# Patient Record
Sex: Male | Born: 1980 | Race: White | Hispanic: No | Marital: Married | Smoking: Never smoker
Health system: Southern US, Community
[De-identification: ages and names within clinical notes are randomized; demographics above are authoritative.]

## PROBLEM LIST (undated history)

## (undated) DIAGNOSIS — Z973 Presence of spectacles and contact lenses: Secondary | ICD-10-CM

## (undated) DIAGNOSIS — Z87442 Personal history of urinary calculi: Secondary | ICD-10-CM

## (undated) DIAGNOSIS — N200 Calculus of kidney: Secondary | ICD-10-CM

## (undated) DIAGNOSIS — N201 Calculus of ureter: Secondary | ICD-10-CM

## (undated) DIAGNOSIS — R001 Bradycardia, unspecified: Secondary | ICD-10-CM

## (undated) HISTORY — PX: NO PAST SURGERIES: SHX2092

---

## 2003-05-20 ENCOUNTER — Encounter: Payer: Self-pay | Admitting: Emergency Medicine

## 2003-05-20 ENCOUNTER — Emergency Department (HOSPITAL_COMMUNITY): Admission: AD | Admit: 2003-05-20 | Discharge: 2003-05-20 | Payer: Self-pay | Admitting: Emergency Medicine

## 2005-11-30 ENCOUNTER — Emergency Department (HOSPITAL_COMMUNITY): Admission: EM | Admit: 2005-11-30 | Discharge: 2005-11-30 | Payer: Self-pay | Admitting: Emergency Medicine

## 2006-05-30 ENCOUNTER — Emergency Department (HOSPITAL_COMMUNITY): Admission: EM | Admit: 2006-05-30 | Discharge: 2006-05-30 | Payer: Self-pay | Admitting: Emergency Medicine

## 2007-01-22 ENCOUNTER — Emergency Department (HOSPITAL_COMMUNITY): Admission: EM | Admit: 2007-01-22 | Discharge: 2007-01-22 | Payer: Self-pay | Admitting: Emergency Medicine

## 2012-09-26 ENCOUNTER — Emergency Department (HOSPITAL_COMMUNITY)
Admission: EM | Admit: 2012-09-26 | Discharge: 2012-09-26 | Disposition: A | Discharge status: Home / Self Care | Payer: Self-pay | Attending: Emergency Medicine | Admitting: Emergency Medicine

## 2012-09-26 ENCOUNTER — Encounter (HOSPITAL_COMMUNITY): Payer: Self-pay | Admitting: Emergency Medicine

## 2012-09-26 DIAGNOSIS — N200 Calculus of kidney: Secondary | ICD-10-CM | POA: Insufficient documentation

## 2012-09-26 DIAGNOSIS — R1110 Vomiting, unspecified: Secondary | ICD-10-CM | POA: Insufficient documentation

## 2012-09-26 LAB — CBC WITH DIFFERENTIAL/PLATELET
Basophils Absolute: 0.1 10*3/uL (ref 0.0–0.1)
HCT: 41 % (ref 39.0–52.0)
Hemoglobin: 14.5 g/dL (ref 13.0–17.0)
Lymphocytes Relative: 36 % (ref 12–46)
Monocytes Absolute: 0.4 10*3/uL (ref 0.1–1.0)
Monocytes Relative: 7 % (ref 3–12)
Neutro Abs: 2.7 10*3/uL (ref 1.7–7.7)
RDW: 12.4 % (ref 11.5–15.5)
WBC: 5.1 10*3/uL (ref 4.0–10.5)

## 2012-09-26 LAB — URINALYSIS, ROUTINE W REFLEX MICROSCOPIC
Glucose, UA: NEGATIVE mg/dL
Leukocytes, UA: NEGATIVE
Specific Gravity, Urine: 1.029 (ref 1.005–1.030)
pH: 5.5 (ref 5.0–8.0)

## 2012-09-26 LAB — BASIC METABOLIC PANEL
CO2: 28 mEq/L (ref 19–32)
Chloride: 103 mEq/L (ref 96–112)
Creatinine, Ser: 0.93 mg/dL (ref 0.50–1.35)
GFR calc Af Amer: >90 mL/min (ref >90)

## 2012-09-26 MED ORDER — SODIUM CHLORIDE 0.9 % IV SOLN
Freq: Once | INTRAVENOUS | Status: AC
  Administered 2012-09-26: 1000 mL via INTRAVENOUS

## 2012-09-26 MED ORDER — ONDANSETRON HCL 4 MG PO TABS: 4.0000 mg | ORAL_TABLET | Freq: Four times a day (QID) | ORAL | Status: DC

## 2012-09-26 MED ORDER — OXYCODONE-ACETAMINOPHEN 5-325 MG PO TABS: ORAL_TABLET | ORAL | Status: DC

## 2012-09-26 MED ORDER — KETOROLAC TROMETHAMINE 10 MG PO TABS: 10.0000 mg | ORAL_TABLET | Freq: Four times a day (QID) | ORAL | Status: DC | PRN

## 2012-09-26 MED ORDER — TAMSULOSIN HCL 0.4 MG PO CAPS: 0.4000 mg | ORAL_CAPSULE | Freq: Every day | ORAL | Status: DC

## 2012-09-26 NOTE — ED Provider Notes (Signed)
History     CSN: [id number]  Arrival date & time 09/26/12  0602   First MD Initiated Contact with Patient 09/26/12 0617      Chief Complaint  Patient presents with  . Flank Pain    (Consider location/radiation/quality/duration/timing/severity/associated sxs/prior treatment) HPI  Micheal Malone is a 31 y.o. male brought in by EMS complaining of right flank pain that radiates to the abdomen onset acute at 4 AM last night. Pain is severe at its worst it is 5/10 right now. Pain is described as sharp and colicky. Patient received Toradol en route and this has helped him significantly. Patient denies hematuria and fever but has had several episodes of nonbilious nonbloody emesis this morning. Patient has history of kidney stones. States that this feels more severe than the prior stones. He has never required any intervention to passed stone and cannot remember the name of his urologist at this time.   History reviewed. No pertinent past medical history.  History reviewed. No pertinent past surgical history.  History reviewed. No pertinent family history.  History  Substance Use Topics  . Smoking status: Never Smoker   . Smokeless tobacco: Current User    Types: Snuff  . Alcohol Use: Yes     Comment: occassionally      Review of Systems  Constitutional: Negative for fever.  Respiratory: Negative for shortness of breath.   Cardiovascular: Negative for chest pain.  Gastrointestinal: Negative for nausea, vomiting, abdominal pain and diarrhea.  Genitourinary: Positive for flank pain. Negative for hematuria and testicular pain.  All other systems reviewed and are negative.    Allergies  Keflex  Home Medications  No current outpatient prescriptions on file.  BP 124/79  Pulse 64  Temp 97.9 F (36.6 C) (Oral)  Resp 18  SpO2 98%  Physical Exam  Nursing note and vitals reviewed. Constitutional: He is oriented to person, place, and time. He appears well-developed and  well-nourished. No distress.       Patient seems calm and comfortable, lying still on the bed  HENT:  Head: Normocephalic.  Eyes: Conjunctivae normal and EOM are normal.  Cardiovascular: Normal rate and regular rhythm.   Pulmonary/Chest: Effort normal and breath sounds normal. No stridor.  Abdominal: Soft. Bowel sounds are normal. He exhibits no distension and no mass. There is tenderness. There is no rebound and no guarding.       Mild RUQ TTP no rebound or other peritoneal signs.  Genitourinary:       Mild bilateral CVA tenderness greater on the right than the left there  Musculoskeletal: Normal range of motion.  Neurological: He is alert and oriented to person, place, and time.  Psychiatric: He has a normal mood and affect.    ED Course  Procedures (including critical care time)  Labs Reviewed  URINALYSIS, ROUTINE W REFLEX MICROSCOPIC - Abnormal; Notable for the following:    Color, Urine AMBER (*)  BIOCHEMICALS MAY BE AFFECTED BY COLOR   APPearance CLOUDY (*)     Hgb urine dipstick LARGE (*)     Bilirubin Urine SMALL (*)     All other components within normal limits  BASIC METABOLIC PANEL - Abnormal; Notable for the following:    Glucose, Bld 116 (*)     All other components within normal limits  URINE MICROSCOPIC-ADD ON - Abnormal; Notable for the following:    Crystals CA OXALATE CRYSTALS (*)     All other components within normal limits  CBC WITH DIFFERENTIAL  No results found.   1. Kidney stone       MDM  Patient defers pain medicine at this time.  Urinalysis shows large amount of hemoglobin consistent with kidney stone. Patient's pain is controlled and he is tolerating by mouth at this time.  Discussed this with attending Dr. Ignacia Palma who agrees that a CAT scan is not indicated at this time. Patient will be discharged with symptomatic control and given a urology referral.  New Prescriptions   KETOROLAC (TORADOL) 10 MG TABLET    Take 1 tablet (10 mg total)  by mouth every 6 (six) hours as needed for pain (Take with food. Do not take more than 4 per day. Do not take for longer than 5 days).   ONDANSETRON (ZOFRAN) 4 MG TABLET    Take 1 tablet (4 mg total) by mouth every 6 (six) hours.   OXYCODONE-ACETAMINOPHEN (PERCOCET/ROXICET) 5-325 MG PER TABLET    1 to 2 tabs PO q6hrs  PRN for pain   TAMSULOSIN HCL (FLOMAX) 0.4 MG CAPS    Take 1 capsule (0.4 mg total) by mouth daily after breakfast.          Wynetta Emery, PA-C 09/26/12 [phone]

## 2012-09-26 NOTE — ED Notes (Signed)
Pt. Presents to the ED via [facility] with right flank pain radiating to the abdomen.  Pt. Has hx. Of kidney stones.  Nausea and vomiting occurred at 4:30 am but only incidence of event; pt. Not currently nauseated.  No distention of the abdomen noted.  Pt complains of 6 out of 10 pain on a 0 to 10 pain scale.

## 2012-09-26 NOTE — ED Notes (Signed)
PO fluid challenge given without any problems.

## 2012-09-27 ENCOUNTER — Emergency Department (HOSPITAL_COMMUNITY)
Admission: EM | Admit: 2012-09-27 | Discharge: 2012-09-27 | Disposition: A | Discharge status: Home / Self Care | Payer: Self-pay | Attending: Emergency Medicine | Admitting: Emergency Medicine

## 2012-09-27 ENCOUNTER — Encounter (HOSPITAL_COMMUNITY): Payer: Self-pay | Admitting: Emergency Medicine

## 2012-09-27 ENCOUNTER — Emergency Department (HOSPITAL_COMMUNITY): Payer: Self-pay

## 2012-09-27 DIAGNOSIS — Z79899 Other long term (current) drug therapy: Secondary | ICD-10-CM | POA: Insufficient documentation

## 2012-09-27 DIAGNOSIS — N200 Calculus of kidney: Secondary | ICD-10-CM | POA: Insufficient documentation

## 2012-09-27 DIAGNOSIS — R319 Hematuria, unspecified: Secondary | ICD-10-CM | POA: Insufficient documentation

## 2012-09-27 DIAGNOSIS — R109 Unspecified abdominal pain: Secondary | ICD-10-CM | POA: Insufficient documentation

## 2012-09-27 DIAGNOSIS — R112 Nausea with vomiting, unspecified: Secondary | ICD-10-CM | POA: Insufficient documentation

## 2012-09-27 MED ORDER — PHENAZOPYRIDINE HCL 100 MG PO TABS
200.0000 mg | ORAL_TABLET | Freq: Once | ORAL | Status: AC
  Administered 2012-09-27: 200 mg via ORAL
  Filled 2012-09-27: qty 2

## 2012-09-27 MED ORDER — BISACODYL 5 MG PO TBEC: 5.0000 mg | DELAYED_RELEASE_TABLET | Freq: Two times a day (BID) | ORAL | Status: DC

## 2012-09-27 MED ORDER — IBUPROFEN 800 MG PO TABS: 800.0000 mg | ORAL_TABLET | Freq: Three times a day (TID) | ORAL | Status: DC

## 2012-09-27 MED ORDER — POLYETHYLENE GLYCOL 3350 17 GM/SCOOP PO POWD: 17.0000 g | Freq: Two times a day (BID) | ORAL | Status: DC

## 2012-09-27 MED ORDER — HYDROMORPHONE HCL PF 1 MG/ML IJ SOLN
0.5000 mg | INTRAMUSCULAR | Status: AC
  Administered 2012-09-27: 0.5 mg via INTRAVENOUS
  Filled 2012-09-27: qty 1

## 2012-09-27 MED ORDER — KETOROLAC TROMETHAMINE 30 MG/ML IJ SOLN
30.0000 mg | Freq: Once | INTRAMUSCULAR | Status: AC
  Administered 2012-09-27: 30 mg via INTRAVENOUS
  Filled 2012-09-27: qty 1

## 2012-09-27 MED ORDER — ONDANSETRON HCL 4 MG/2ML IJ SOLN
4.0000 mg | Freq: Once | INTRAMUSCULAR | Status: AC
  Administered 2012-09-27: 4 mg via INTRAVENOUS
  Filled 2012-09-27: qty 2

## 2012-09-27 MED ORDER — PHENAZOPYRIDINE HCL 200 MG PO TABS: 200.0000 mg | ORAL_TABLET | Freq: Three times a day (TID) | ORAL | Status: DC

## 2012-09-27 MED ORDER — LACTATED RINGERS IV BOLUS (SEPSIS)
1000.0000 mL | Freq: Once | INTRAVENOUS | Status: AC
  Administered 2012-09-27: 1000 mL via INTRAVENOUS

## 2012-09-27 NOTE — ED Provider Notes (Signed)
History     CSN: [id number]  Arrival date & time 09/27/12  4098   First MD Initiated Contact with Patient 09/27/12 [phone]      Chief Complaint  Patient presents with  . Nephrolithiasis    (Consider location/radiation/quality/duration/timing/severity/associated sxs/prior treatment) HPI Micheal Malone is a 31 y.o. male who presents to the emergency department yesterday with nephrolithiasis. Patient had a workup showing blood in his urine, kidney stone on the right. He was discharged home with pain medicine and Flomax. Patient's pain came back this morning he did not fill the prescriptions for pain medicine, pain started at 0400 this morning, it is 10 out of 10 in intensity, it's in the right flank that radiates around to the right groin, associated with nausea vomiting, blood in the urine. There's been no fevers, no chills, no dysuria, no frequency. No chest pain, no shortness of breath.   Past Medical History  Diagnosis Date  . Renal stones     History reviewed. No pertinent past surgical history.  No family history on file.  History  Substance Use Topics  . Smoking status: Never Smoker   . Smokeless tobacco: Current User    Types: Snuff  . Alcohol Use: Yes     Comment: occassionally     Review of Systems At least 10pt or greater review of systems completed and are negative except where specified in the HPI.  Allergies  Keflex  Home Medications   Current Outpatient Rx  Name  Route  Sig  Dispense  Refill  . KETOROLAC TROMETHAMINE 10 MG PO TABS   Oral   Take 1 tablet (10 mg total) by mouth every 6 (six) hours as needed for pain (Take with food. Do not take more than 4 per day. Do not take for longer than 5 days).   20 tablet   0   . ONDANSETRON HCL 4 MG PO TABS   Oral   Take 1 tablet (4 mg total) by mouth every 6 (six) hours.   12 tablet   0   . OXYCODONE-ACETAMINOPHEN 5-325 MG PO TABS      1 to 2 tabs PO q6hrs  PRN for pain   15 tablet   0   .  TAMSULOSIN HCL 0.4 MG PO CAPS   Oral   Take 1 capsule (0.4 mg total) by mouth daily after breakfast.   10 capsule   0     There were no vitals taken for this visit.  Physical Exam  Nursing notes reviewed.  Electronic medical record reviewed. VITAL SIGNS:  There were no vitals filed for this visit. CONSTITUTIONAL: Awake, oriented, appears non-toxic HENT: Atraumatic, normocephalic, oral mucosa pink and moist, airway patent. Nares patent without drainage. External ears normal. EYES: Conjunctiva clear, EOMI, PERRLA NECK: Trachea midline, non-tender, supple CARDIOVASCULAR: Normal heart rate, Normal rhythm, No murmurs, rubs, gallops PULMONARY/CHEST: Clear to auscultation, no rhonchi, wheezes, or rales. Symmetrical breath sounds. Non-tender. ABDOMINAL: Non-distended, soft, non-tender - no rebound or guarding.  BS normal. Tenderness to percussion in the right flank GU: Normal circumcised male. No tenderness to palpation in the scrotum, testicles or epididymis, no swelling, no erythema, no rash, no penile discharge, no hernias appreciated. NEUROLOGIC: Non-focal, moving all four extremities, no gross sensory or motor deficits. EXTREMITIES: No clubbing, cyanosis, or edema SKIN: Warm, Dry, No erythema, No rash  ED Course  Procedures (including critical care time)  Labs Reviewed - No data to display Dg Abd 2 Views  09/27/2012  *RADIOLOGY  REPORT*  Clinical Data: 31 year old male right flank pain history of kidney stones.  Constipation.  ABDOMEN - 2 VIEW  Comparison: CT abdomen pelvis 05/30/2006.  Findings: Negative lung bases.  Mild scoliosis. Nonobstructed bowel gas pattern.  There is retained stool throughout the right and transverse colon.  No urologic calculus identified.  Abdominal pelvic visceral contours are within normal limits. No acute osseous abnormality identified.  IMPRESSION: No urologic calculus identified.  If there is continued strong suspicion of renal colic, noncontrast CT  abdomen pelvis would be more sensitive. Nonobstructed bowel gas pattern.   Original Report Authenticated By: Erskine Speed, M.D.      1. Right flank pain       MDM  Micheal Malone is a 31 y.o. male presents with kidney stone - patient and the imaging yesterday, will add KUB just to image and find out where the stone has moved to, we'll treat the patient symptomatically and get control of his pain and nausea.  Patient is feeling much better, no longer nauseous and his pain is well controlled, he is comfortable laying on the bed.  X-ray of the abdomen showed no opaque stone.  Patient may have a radiolucent stone. Patient did have blood in the urine yesterday.  Genitourinary exam is within normal limits. Patient does not have right lower quadrant tenderness. He has pain in the right flank, I do not think that this is an appendicitis, that he does be colitis or perforated viscus, do not think any advanced imaging is needed at this time, I also do not think any repeat laboratory studies are needed at this time. Patient is feeling much better will be discharged to home with pain medicine. She is also complaining about not having had a bowel movement in about the last 3 days, said this is likely due to his coming to the emergency department and receiving opioid pain medicine-we'll put him on a bowel regimen of stool softeners and twice a day GlycoLax.  I explained the diagnosis and have given explicit precautions to return to the ER including worsening pain, bloody diarrhea, vomiting,  any pain in his abdomen or any other new or worsening symptoms. The patient understands and accepts the medical plan as it's been dictated and I have answered their questions. Discharge instructions concerning home care and prescriptions have been given.  The patient is STABLE and is discharged to home in good condition.          Jones Skene, MD 09/27/12 1148

## 2012-09-27 NOTE — ED Notes (Signed)
PT. REPORTS PERSISTENT RIGHT FLANK PAIN / VOMITTING  SEEN HERE LAST NIGHT DIAGNOSED WITH RENAL STONES - DID NOT FILL PRESCRIPTIONS.

## 2012-09-28 NOTE — ED Provider Notes (Signed)
Medical screening examination/treatment/procedure(s) were performed by non-physician practitioner and as supervising physician I was immediately available for consultation/collaboration.  Rosa Wyly K Lourene Hoston, MD 09/28/12 0114 

## 2014-01-21 ENCOUNTER — Encounter (HOSPITAL_COMMUNITY): Payer: Self-pay | Admitting: Emergency Medicine

## 2014-01-21 ENCOUNTER — Emergency Department (HOSPITAL_COMMUNITY)
Admission: EM | Admit: 2014-01-21 | Discharge: 2014-01-21 | Disposition: A | Discharge status: Home / Self Care | Payer: No Typology Code available for payment source | Attending: Emergency Medicine | Admitting: Emergency Medicine

## 2014-01-21 ENCOUNTER — Emergency Department (HOSPITAL_COMMUNITY): Payer: No Typology Code available for payment source

## 2014-01-21 DIAGNOSIS — R509 Fever, unspecified: Secondary | ICD-10-CM | POA: Insufficient documentation

## 2014-01-21 DIAGNOSIS — M436 Torticollis: Secondary | ICD-10-CM | POA: Insufficient documentation

## 2014-01-21 DIAGNOSIS — R0602 Shortness of breath: Secondary | ICD-10-CM | POA: Insufficient documentation

## 2014-01-21 DIAGNOSIS — M542 Cervicalgia: Secondary | ICD-10-CM | POA: Insufficient documentation

## 2014-01-21 DIAGNOSIS — IMO0001 Reserved for inherently not codable concepts without codable children: Secondary | ICD-10-CM | POA: Insufficient documentation

## 2014-01-21 DIAGNOSIS — Z87442 Personal history of urinary calculi: Secondary | ICD-10-CM | POA: Insufficient documentation

## 2014-01-21 DIAGNOSIS — R6889 Other general symptoms and signs: Secondary | ICD-10-CM

## 2014-01-21 LAB — RAPID STREP SCREEN (MED CTR MEBANE ONLY): Streptococcus, Group A Screen (Direct): NEGATIVE

## 2014-01-21 LAB — CBC WITH DIFFERENTIAL/PLATELET
Basophils Absolute: 0 10*3/uL (ref 0.0–0.1)
Basophils Relative: 0 % (ref 0–1)
Eosinophils Absolute: 0 10*3/uL (ref 0.0–0.7)
Eosinophils Relative: 0 % (ref 0–5)
HCT: 43.8 % (ref 39.0–52.0)
Hemoglobin: 15.4 g/dL (ref 13.0–17.0)
Lymphocytes Relative: 12 % (ref 12–46)
Lymphs Abs: 1.4 10*3/uL (ref 0.7–4.0)
MCH: 30.1 pg (ref 26.0–34.0)
MCHC: 35.2 g/dL (ref 30.0–36.0)
MCV: 85.7 fL (ref 78.0–100.0)
Monocytes Absolute: 1 10*3/uL (ref 0.1–1.0)
Monocytes Relative: 9 % (ref 3–12)
Neutro Abs: 9 10*3/uL — ABNORMAL HIGH (ref 1.7–7.7)
Neutrophils Relative %: 78 % — ABNORMAL HIGH (ref 43–77)
Platelets: 238 10*3/uL (ref 150–400)
RBC: 5.11 MIL/uL (ref 4.22–5.81)
RDW: 12.6 % (ref 11.5–15.5)
WBC: 11.5 10*3/uL — ABNORMAL HIGH (ref 4.0–10.5)

## 2014-01-21 LAB — URINE MICROSCOPIC-ADD ON

## 2014-01-21 LAB — URINALYSIS, ROUTINE W REFLEX MICROSCOPIC
Glucose, UA: NEGATIVE mg/dL
Hgb urine dipstick: NEGATIVE
Ketones, ur: 15 mg/dL — AB
Nitrite: NEGATIVE
Protein, ur: 30 mg/dL — AB
Specific Gravity, Urine: 1.037 — ABNORMAL HIGH (ref 1.005–1.030)
Urobilinogen, UA: 1 mg/dL (ref 0.0–1.0)
pH: 6 (ref 5.0–8.0)

## 2014-01-21 LAB — BASIC METABOLIC PANEL
BUN: 15 mg/dL (ref 6–23)
CALCIUM: 9.7 mg/dL (ref 8.4–10.5)
CO2: 26 mEq/L (ref 19–32)
Chloride: 98 mEq/L (ref 96–112)
Creatinine, Ser: 1.02 mg/dL (ref 0.50–1.35)
Glucose, Bld: 95 mg/dL (ref 70–99)
POTASSIUM: 3.9 meq/L (ref 3.7–5.3)
SODIUM: 137 meq/L (ref 137–147)

## 2014-01-21 MED ORDER — KETOROLAC TROMETHAMINE 30 MG/ML IJ SOLN
30.0000 mg | Freq: Once | INTRAMUSCULAR | Status: AC
  Administered 2014-01-21: 30 mg via INTRAVENOUS
  Filled 2014-01-21: qty 1

## 2014-01-21 MED ORDER — IBUPROFEN 600 MG PO TABS: 600.0000 mg | ORAL_TABLET | Freq: Four times a day (QID) | ORAL | Status: DC | PRN

## 2014-01-21 NOTE — ED Notes (Signed)
Pt tolerating oral fluids well 

## 2014-01-21 NOTE — ED Notes (Addendum)
Pt from home with c/o fo fever of 103.5 starting last pm.  Tylenol would bring it down to 101.  Pt attempted to see PCP but was unable to get an appointment.  Stiffness in neck and back starting last pm yesterday also.  Pt states he worked yesterday with no complaints until last night.  Denies cough or N/V. Pt in NAD, A&O.

## 2014-01-21 NOTE — ED Provider Notes (Signed)
CSN: [id number]     Arrival date & time 01/21/14  0746 History   First MD Initiated Contact with Patient 01/21/14 [phone]     Chief Complaint  Patient presents with  . Fever  . Torticollis     (Consider location/radiation/quality/duration/timing/severity/associated sxs/prior Treatment) HPI  This is a 33 yo old male who presents with fever and myalgias.  Patient reports onset of symptoms last night. Patient reports fever to 103.5. He took Tylenol approximately 1-1/2 hours prior to arrival. Patient states that last night he felt "a little short of breath." He denies any cough or chest pain. He denies any nausea, vomiting, or diarrhea. Patient's ex-wife does have strep throat. He denies any sore throat. Patient does endorse myalgias and neck pain. He denies any rash. He denies any headache.  Past Medical History  Diagnosis Date  . Renal stones    History reviewed. No pertinent past surgical history. History reviewed. No pertinent family history. History  Substance Use Topics  . Smoking status: Never Smoker   . Smokeless tobacco: Current User    Types: Chew  . Alcohol Use: 1.2 oz/week    2 Cans of beer per week     Comment: occassionally    Review of Systems  Constitutional: Positive for fever and chills.  HENT: Negative for congestion and sore throat.   Respiratory: Positive for shortness of breath. Negative for cough and chest tightness.   Cardiovascular: Negative.  Negative for chest pain.  Gastrointestinal: Negative.  Negative for nausea, vomiting, abdominal pain and diarrhea.  Genitourinary: Negative.  Negative for dysuria.  Musculoskeletal: Positive for neck pain. Negative for back pain and neck stiffness.  Skin: Negative for rash.  Neurological: Negative for headaches.  All other systems reviewed and are negative.      Allergies  Keflex  Home Medications   Current Outpatient Rx  Name  Route  Sig  Dispense  Refill  . acetaminophen (TYLENOL) 500 MG tablet   Oral   Take 1,000 mg by mouth every 6 (six) hours as needed for mild pain or fever.         Marland Kitchen. ibuprofen (ADVIL,MOTRIN) 600 MG tablet   Oral   Take 1 tablet (600 mg total) by mouth every 6 (six) hours as needed.   30 tablet   0    BP 113/78  Pulse 73  Temp(Src) 98.7 F (37.1 C) (Oral)  Resp 21  SpO2 96% Physical Exam  Nursing note and vitals reviewed. Constitutional: He is oriented to person, place, and time. He appears well-developed and well-nourished. No distress.  HENT:  Head: Normocephalic and atraumatic.  Right Ear: External ear normal.  Left Ear: External ear normal.  Mouth/Throat: Oropharynx is clear and moist. No oropharyngeal exudate.  Eyes: EOM are normal. Pupils are equal, round, and reactive to light.  Neck: Normal range of motion. Neck supple.  No meningismus noted  Cardiovascular: Normal rate, regular rhythm and normal heart sounds.   No murmur heard. Pulmonary/Chest: Effort normal and breath sounds normal. No respiratory distress. He has no wheezes.  Abdominal: Soft. Bowel sounds are normal. There is no tenderness. There is no rebound and no guarding.  Musculoskeletal: He exhibits no edema.  Lymphadenopathy:    He has no cervical adenopathy.  Neurological: He is alert and oriented to person, place, and time.  Skin: Skin is warm and dry. No rash noted.  Psychiatric: He has a normal mood and affect.    ED Course  Procedures (including critical care time)  Labs Review Labs Reviewed  CBC WITH DIFFERENTIAL - Abnormal; Notable for the following:    WBC 11.5 (*)    Neutrophils Relative % 78 (*)    Neutro Abs 9.0 (*)    All other components within normal limits  URINALYSIS, ROUTINE W REFLEX MICROSCOPIC - Abnormal; Notable for the following:    Color, Urine AMBER (*)    Specific Gravity, Urine 1.037 (*)    Bilirubin Urine SMALL (*)    Ketones, ur 15 (*)    Protein, ur 30 (*)    Leukocytes, UA TRACE (*)    All other components within normal limits  URINE  MICROSCOPIC-ADD ON - Abnormal; Notable for the following:    Bacteria, UA FEW (*)    All other components within normal limits  RAPID STREP SCREEN  CULTURE, GROUP A STREP  BASIC METABOLIC PANEL   Imaging Review Dg Chest 2 View  01/21/2014   CLINICAL DATA:  Fever  EXAM: CHEST  2 VIEW  COMPARISON:  August 08, 2007  FINDINGS: The lungs are clear. Heart size pulmonary vascularity are normal. No adenopathy. No bone lesions. .  IMPRESSION: No edema or consolidation.   Electronically Signed   By: Bretta Bang M.D.   On: 01/21/2014 08:36     EKG Interpretation None      MDM   Final diagnoses:  Flu-like symptoms    Patient presents with fever and myalgias. He is nontoxic on exam. He is afebrile. Has had a sick contact with strep throat. No evidence of rash, headache or meningismus. Reported neck pain in triage but I believe this is related to myalgias.  Patient was given Toradol. Work up is notable for mild leukocytosis with left shift. Otherwise workup is reassuring. Strep is negative.  Patient continues to remain stable in the ER. Discussed with patient my suspicion that this is all viral process. Also discussed with patient that cannot rule out meningitis without a spinal tap; however, have low suspicion at this time for bacterial meningitis. Patient stated understanding. He was given strict return precautions. He is to use ibuprofen for myalgias and stay hydrated. Patient will return with any new or worsening symptoms.  After history, exam, and medical workup I feel the patient has been appropriately medically screened and is safe for discharge home. Pertinent diagnoses were discussed with the patient. Patient was given return precautions.    Shon Baton, MD 01/21/14 1130

## 2014-01-21 NOTE — Discharge Instructions (Signed)

## 2014-01-23 ENCOUNTER — Emergency Department (HOSPITAL_COMMUNITY)
Admission: EM | Admit: 2014-01-23 | Discharge: 2014-01-24 | Disposition: A | Discharge status: Home / Self Care | Payer: No Typology Code available for payment source | Attending: Emergency Medicine | Admitting: Emergency Medicine

## 2014-01-23 ENCOUNTER — Encounter (HOSPITAL_COMMUNITY): Payer: Self-pay | Admitting: Emergency Medicine

## 2014-01-23 DIAGNOSIS — R599 Enlarged lymph nodes, unspecified: Secondary | ICD-10-CM | POA: Insufficient documentation

## 2014-01-23 DIAGNOSIS — Z87442 Personal history of urinary calculi: Secondary | ICD-10-CM | POA: Insufficient documentation

## 2014-01-23 DIAGNOSIS — R51 Headache: Secondary | ICD-10-CM | POA: Insufficient documentation

## 2014-01-23 DIAGNOSIS — IMO0001 Reserved for inherently not codable concepts without codable children: Secondary | ICD-10-CM | POA: Insufficient documentation

## 2014-01-23 DIAGNOSIS — R509 Fever, unspecified: Secondary | ICD-10-CM

## 2014-01-23 LAB — CBC WITH DIFFERENTIAL/PLATELET
BASOS ABS: 0 10*3/uL (ref 0.0–0.1)
BASOS PCT: 0 % (ref 0–1)
Eosinophils Absolute: 0.1 10*3/uL (ref 0.0–0.7)
Eosinophils Relative: 2 % (ref 0–5)
HEMATOCRIT: 39.5 % (ref 39.0–52.0)
Hemoglobin: 14 g/dL (ref 13.0–17.0)
LYMPHS PCT: 31 % (ref 12–46)
Lymphs Abs: 2.3 10*3/uL (ref 0.7–4.0)
MCH: 30.1 pg (ref 26.0–34.0)
MCHC: 35.4 g/dL (ref 30.0–36.0)
MCV: 84.9 fL (ref 78.0–100.0)
MONO ABS: 0.7 10*3/uL (ref 0.1–1.0)
Monocytes Relative: 9 % (ref 3–12)
NEUTROS ABS: 4.3 10*3/uL (ref 1.7–7.7)
NEUTROS PCT: 58 % (ref 43–77)
Platelets: 238 10*3/uL (ref 150–400)
RBC: 4.65 MIL/uL (ref 4.22–5.81)
RDW: 12.9 % (ref 11.5–15.5)
WBC: 7.4 10*3/uL (ref 4.0–10.5)

## 2014-01-23 LAB — CULTURE, GROUP A STREP

## 2014-01-23 LAB — URINALYSIS, ROUTINE W REFLEX MICROSCOPIC
Bilirubin Urine: NEGATIVE
Glucose, UA: NEGATIVE mg/dL
Hgb urine dipstick: NEGATIVE
Ketones, ur: NEGATIVE mg/dL
Leukocytes, UA: NEGATIVE
Nitrite: NEGATIVE
Protein, ur: NEGATIVE mg/dL
Specific Gravity, Urine: 1.018 (ref 1.005–1.030)
Urobilinogen, UA: >8 mg/dL — ABNORMAL HIGH (ref 0.0–1.0)
pH: 7 (ref 5.0–8.0)

## 2014-01-23 MED ORDER — KETOROLAC TROMETHAMINE 60 MG/2ML IM SOLN
60.0000 mg | Freq: Once | INTRAMUSCULAR | Status: AC
  Administered 2014-01-23: 60 mg via INTRAMUSCULAR
  Filled 2014-01-23: qty 2

## 2014-01-23 NOTE — ED Notes (Signed)
Patient given a urinal and made him aware that a urine sample is needed.

## 2014-01-23 NOTE — ED Provider Notes (Signed)
CSN: [id number]     Arrival date & time 01/23/14  1714 History   First MD Initiated Contact with Patient 01/23/14 2240     Chief Complaint  Patient presents with  . Fever     (Consider location/radiation/quality/duration/timing/severity/associated sxs/prior Treatment) Patient is a 33 y.o. male presenting with fever. The history is provided by the patient and medical records. No language interpreter was used.  Fever Temp source:  Subjective Severity:  Moderate Onset quality:  Sudden Duration:  2 days Timing:  Sporadic Chronicity:  New Relieved by:  Ibuprofen Associated symptoms: headaches and myalgias   Associated symptoms: no chest pain, no dysuria and no rash     Past Medical History  Diagnosis Date  . Renal stones    History reviewed. No pertinent past surgical history. No family history on file. History  Substance Use Topics  . Smoking status: Never Smoker   . Smokeless tobacco: Current User    Types: Chew  . Alcohol Use: 1.2 oz/week    2 Cans of beer per week     Comment: occassionally    Review of Systems  Constitutional: Positive for fever.  Cardiovascular: Negative for chest pain.  Genitourinary: Negative for dysuria.  Musculoskeletal: Positive for myalgias.  Skin: Negative for rash.  Neurological: Positive for headaches.  All other systems reviewed and are negative.      Allergies  Keflex  Home Medications   Current Outpatient Rx  Name  Route  Sig  Dispense  Refill  . acetaminophen (TYLENOL) 500 MG tablet   Oral   Take 1,000 mg by mouth every 6 (six) hours as needed for mild pain or fever.         Marland Kitchen. ibuprofen (ADVIL,MOTRIN) 600 MG tablet   Oral   Take 1 tablet (600 mg total) by mouth every 6 (six) hours as needed.   30 tablet   0    BP 114/72  Pulse 72  Temp(Src) 98.4 F (36.9 C) (Oral)  Resp 18  Ht 5\' 11"  (1.803 m)  Wt 172 lb (78.019 kg)  BMI 24.00 kg/m2  SpO2 100% Physical Exam  Nursing note and vitals  reviewed. Constitutional: He is oriented to person, place, and time. He appears well-developed and well-nourished.  HENT:  Head: Normocephalic and atraumatic.  Mouth/Throat: No oropharyngeal exudate.  Eyes: Pupils are equal, round, and reactive to light.  Neck: Normal range of motion.  Cardiovascular: Normal rate and regular rhythm.   Pulmonary/Chest: Effort normal and breath sounds normal.  Abdominal: Soft. Bowel sounds are normal. There is no tenderness.  Musculoskeletal: Normal range of motion. He exhibits no edema and no tenderness.  Lymphadenopathy:    He has cervical adenopathy.  Neurological: He is alert and oriented to person, place, and time.  Skin: Skin is warm and dry.  Psychiatric: He has a normal mood and affect. His behavior is normal. Judgment and thought content normal.    ED Course  Procedures (including critical care time) Labs Review Labs Reviewed  URINALYSIS, ROUTINE W REFLEX MICROSCOPIC  CBC WITH DIFFERENTIAL   Imaging Review No results found.   EKG Interpretation None     Intermittent fever.  No leukocytosis.  Generalized body aches.  Likely viral illness.  Patient has an appointment with his PCP on Monday, 01/27/14. MDM   Final diagnoses:  None    Viral illness.    Jimmye Normanavid John Donley Harland, NP 01/24/14 [phone]

## 2014-01-23 NOTE — ED Notes (Signed)
Pt st's he was seen here 2 days ago with flu type symptoms but was neg for flu.  Pt st's he has continued to have elevated temp with low back pain, swollen neck and now has diarrhea.

## 2014-01-23 NOTE — ED Notes (Signed)
Patient states that he was seen and treated here on Tuesday and sent home with "ibuprofen." Patient states that his fever has been above 100 all day, and that he has developed a "sore throat"

## 2014-01-24 NOTE — Discharge Instructions (Signed)
Viral Infections °A virus is a type of germ. Viruses can cause: °· Minor sore throats. °· Aches and pains. °· Headaches. °· Runny nose. °· Rashes. °· Watery eyes. °· Tiredness. °· Coughs. °· Loss of appetite. °· Feeling sick to your stomach (nausea). °· Throwing up (vomiting). °· Watery poop (diarrhea). °HOME CARE  °· Only take medicines as told by your doctor. °· Drink enough water and fluids to keep your pee (urine) clear or pale yellow. Sports drinks are a good choice. °· Get plenty of rest and eat healthy. Soups and broths with crackers or rice are fine. °GET HELP RIGHT AWAY IF:  °· You have a very bad headache. °· You have shortness of breath. °· You have chest pain or neck pain. °· You have an unusual rash. °· You cannot stop throwing up. °· You have watery poop that does not stop. °· You cannot keep fluids down. °· You or your child has a temperature by mouth above 102° F (38.9° C), not controlled by medicine. °· Your baby is older than 3 months with a rectal temperature of 102° F (38.9° C) or higher. °· Your baby is 3 months old or younger with a rectal temperature of 100.4° F (38° C) or higher. °MAKE SURE YOU:  °· Understand these instructions. °· Will watch this condition. °· Will get help right away if you are not doing well or get worse. °Document Released: 10/06/2008 Document Revised: 01/16/2012 Document Reviewed: 03/01/2011 °[employer]® Patient Information ©2014 [employer]. ° °

## 2014-01-27 NOTE — ED Provider Notes (Signed)
Medical screening examination/treatment/procedure(s) were performed by non-physician practitioner and as supervising physician I was immediately available for consultation/collaboration.   EKG Interpretation None        Gavin PoundMichael Y. Oletta LamasGhim, MD 01/27/14 [phone]

## 2015-09-09 ENCOUNTER — Ambulatory Visit (INDEPENDENT_AMBULATORY_CARE_PROVIDER_SITE_OTHER): Payer: Self-pay | Admitting: Family Medicine

## 2015-09-09 DIAGNOSIS — Z021 Encounter for pre-employment examination: Secondary | ICD-10-CM

## 2015-09-09 DIAGNOSIS — Z024 Encounter for examination for driving license: Secondary | ICD-10-CM

## 2015-09-09 NOTE — Progress Notes (Signed)
Commercial Driver Medical Examination   Micheal Malone is a 34 y.o. male who presents today for a commercial driver fitness determination physical exam. The patient reports no problems. The following portions of the patient's history were reviewed and updated as appropriate: allergies, current medications, past medical history, past social history, past surgical history and problem list. Review of Systems A comprehensive review of systems was negative.   Objective:    Vision:   Visual Acuity Screening   Right eye Left eye Both eyes  Without correction: 20/25 20/25 20/25   With correction:     Comments: Peripheral Vision: Right eye 85  degrees. Left eye 85  degrees.The patient can distinguish the colors red, amber and green.  Applicant can recognize and distinguish among traffic control signals and devices showing standard red, green, and amber colors.     Monocular Vision?: No   Hearing: Hearing Screening Comments: The patient was able to hear a forced whisper from 10 feet.  BP 120/80, HR 83 There were no vitals taken for this visit.  General Appearance:    Alert, cooperative, no distress, appears stated age  Head:    Normocephalic, without obvious abnormality, atraumatic  Eyes:    PERRL, conjunctiva/corneas clear, EOM's intact, fundi    benign, both eyes       Ears:    Normal TM's and external ear canals, both ears  Nose:   Nares normal, septum midline, mucosa normal, no drainage    or sinus tenderness  Throat:   Lips, mucosa, and tongue normal; teeth and gums normal  Neck:   Supple, symmetrical, trachea midline, no adenopathy;       thyroid:  No enlargement/tenderness/nodules; no carotid   bruit or JVD  Back:     Symmetric, no curvature, ROM normal, no CVA tenderness  Lungs:     Clear to auscultation bilaterally, respirations unlabored  Chest wall:    No tenderness or deformity  Heart:    Regular rate and rhythm, S1 and S2 normal, no murmur, rub   or gallop   Abdomen:     Soft, non-tender, bowel sounds active all four quadrants,    no masses, no organomegaly  Genitalia:    Normal male without lesion, discharge or tenderness, no inguinal hernias.     Extremities:   Extremities normal, atraumatic, no cyanosis or edema  Pulses:   2+ and symmetric all extremities  Skin:   Skin color, texture, turgor normal, no rashes or lesions  Lymph nodes:   Cervical, supraclavicular, and axillary nodes normal  Neurologic:   CNII-XII intact. Normal strength, sensation and reflexes      throughout    Labs: Lab Results  Component Value Date   PROTEINUR NEGATIVE 01/23/2014   BILIRUBINUR NEGATIVE 01/23/2014   GLUCOSEU NEGATIVE 01/23/2014  All above prot, gluc, blood neg, SG 1.010 09/09/2015    Assessment:    Healthy male exam.  Meets standards in 349 CFR 391.41;  qualifies for 2 year certificate.    Plan:    Medical examiners certificate completed and printed. Return as needed.

## 2016-09-22 ENCOUNTER — Emergency Department (HOSPITAL_COMMUNITY): Payer: BLUE CROSS/BLUE SHIELD

## 2016-09-22 ENCOUNTER — Encounter (HOSPITAL_COMMUNITY): Payer: Self-pay | Admitting: Radiology

## 2016-09-22 ENCOUNTER — Emergency Department (HOSPITAL_COMMUNITY)
Admission: EM | Admit: 2016-09-22 | Discharge: 2016-09-22 | Disposition: A | Discharge status: Home / Self Care | Payer: BLUE CROSS/BLUE SHIELD | Attending: Emergency Medicine | Admitting: Emergency Medicine

## 2016-09-22 DIAGNOSIS — Z79899 Other long term (current) drug therapy: Secondary | ICD-10-CM | POA: Diagnosis not present

## 2016-09-22 DIAGNOSIS — R1031 Right lower quadrant pain: Secondary | ICD-10-CM | POA: Diagnosis present

## 2016-09-22 DIAGNOSIS — N200 Calculus of kidney: Secondary | ICD-10-CM

## 2016-09-22 LAB — URINE MICROSCOPIC-ADD ON

## 2016-09-22 LAB — URINALYSIS, ROUTINE W REFLEX MICROSCOPIC
Bilirubin Urine: NEGATIVE
Glucose, UA: NEGATIVE mg/dL
Ketones, ur: NEGATIVE mg/dL
Leukocytes, UA: NEGATIVE
Nitrite: NEGATIVE
Protein, ur: 30 mg/dL — AB
Specific Gravity, Urine: 1.028 (ref 1.005–1.030)
pH: 6 (ref 5.0–8.0)

## 2016-09-22 LAB — COMPREHENSIVE METABOLIC PANEL
ALT: 21 U/L (ref 17–63)
AST: 19 U/L (ref 15–41)
Albumin: 4.5 g/dL (ref 3.5–5.0)
Alkaline Phosphatase: 42 U/L (ref 38–126)
Anion gap: 7 (ref 5–15)
BUN: 16 mg/dL (ref 6–20)
CO2: 26 mmol/L (ref 22–32)
Calcium: 9.1 mg/dL (ref 8.9–10.3)
Chloride: 108 mmol/L (ref 101–111)
Creatinine, Ser: 0.87 mg/dL (ref 0.61–1.24)
GFR calc Af Amer: >60 mL/min (ref >60)
GFR calc non Af Amer: >60 mL/min (ref >60)
Glucose, Bld: 114 mg/dL — ABNORMAL HIGH (ref 65–99)
Potassium: 4.1 mmol/L (ref 3.5–5.1)
Sodium: 141 mmol/L (ref 135–145)
Total Bilirubin: 1.2 mg/dL (ref 0.3–1.2)
Total Protein: 7.3 g/dL (ref 6.5–8.1)

## 2016-09-22 LAB — CBC WITH DIFFERENTIAL/PLATELET
Basophils Absolute: 0 10*3/uL (ref 0.0–0.1)
Basophils Relative: 0 %
Eosinophils Absolute: 0 10*3/uL (ref 0.0–0.7)
Eosinophils Relative: 1 %
HCT: 39.6 % (ref 39.0–52.0)
Hemoglobin: 14 g/dL (ref 13.0–17.0)
Lymphocytes Relative: 15 %
Lymphs Abs: 1.3 10*3/uL (ref 0.7–4.0)
MCH: 29.9 pg (ref 26.0–34.0)
MCHC: 35.4 g/dL (ref 30.0–36.0)
MCV: 84.4 fL (ref 78.0–100.0)
Monocytes Absolute: 0.4 10*3/uL (ref 0.1–1.0)
Monocytes Relative: 5 %
Neutro Abs: 6.9 10*3/uL (ref 1.7–7.7)
Neutrophils Relative %: 79 %
Platelets: 239 10*3/uL (ref 150–400)
RBC: 4.69 MIL/uL (ref 4.22–5.81)
RDW: 12.7 % (ref 11.5–15.5)
WBC: 8.7 10*3/uL (ref 4.0–10.5)

## 2016-09-22 LAB — LIPASE, BLOOD: Lipase: 30 U/L (ref 11–51)

## 2016-09-22 MED ORDER — IOPAMIDOL (ISOVUE-300) INJECTION 61%
INTRAVENOUS | Status: AC
  Filled 2016-09-22: qty 100

## 2016-09-22 MED ORDER — KETOROLAC TROMETHAMINE 30 MG/ML IJ SOLN
30.0000 mg | Freq: Once | INTRAMUSCULAR | Status: AC
  Administered 2016-09-22: 30 mg via INTRAVENOUS
  Filled 2016-09-22: qty 1

## 2016-09-22 MED ORDER — TAMSULOSIN HCL 0.4 MG PO CAPS
0.4000 mg | ORAL_CAPSULE | Freq: Every day | ORAL | Status: DC
  Administered 2016-09-22: 0.4 mg via ORAL
  Filled 2016-09-22: qty 1

## 2016-09-22 MED ORDER — MORPHINE SULFATE (PF) 4 MG/ML IV SOLN
4.0000 mg | Freq: Once | INTRAVENOUS | Status: AC
  Administered 2016-09-22: 4 mg via INTRAVENOUS
  Filled 2016-09-22: qty 1

## 2016-09-22 MED ORDER — IOPAMIDOL (ISOVUE-300) INJECTION 61%
100.0000 mL | Freq: Once | INTRAVENOUS | Status: AC | PRN
  Administered 2016-09-22: 100 mL via INTRAVENOUS

## 2016-09-22 MED ORDER — OXYCODONE-ACETAMINOPHEN 5-325 MG PO TABS: 2.0000 | ORAL_TABLET | ORAL | 0 refills | Status: DC | PRN

## 2016-09-22 MED ORDER — TAMSULOSIN HCL 0.4 MG PO CAPS: 0.4000 mg | ORAL_CAPSULE | Freq: Every day | ORAL | 0 refills | Status: DC

## 2016-09-22 NOTE — ED Provider Notes (Signed)
[facility]-EMERGENCY DEPT Provider Note   CSN: [id number] Arrival date & time: 09/22/16  [id number]     History   Chief Complaint Chief Complaint  Patient presents with  . Abdominal Pain    HPI Micheal Malone is a 35 y.o. male.  HPI   35 year old male presents today with complaints of abdominal pain. Patient reports he was woke from sleep around 5 AM this morning with sharp right lower quadrant abdominal pain. Patient reports nausea and vomiting, but denies any diarrhea. He notes a history of nephrolithiasis, but reports that this pain appears different from previous. He reports pain is radiating into his groin, he denies any testicular pain, no pain to palpation, no swelling. Patient denies any history of abdominal surgeries, denies any exposure to abnormal food or drink, denies any fever or chills.  Past Medical History:  Diagnosis Date  . Renal stones     There are no active problems to display for this patient.   No past surgical history on file.     Home Medications    Prior to Admission medications   Medication Sig Start Date End Date Taking? Authorizing Provider  oxyCODONE-acetaminophen (PERCOCET/ROXICET) 5-325 MG tablet Take 2 tablets by mouth every 4 (four) hours as needed for severe pain. 09/22/16   Eyvonne MechanicJeffrey Meliya Mcconahy, PA-C  tamsulosin (FLOMAX) 0.4 MG CAPS capsule Take 1 capsule (0.4 mg total) by mouth daily. 09/22/16   Eyvonne MechanicJeffrey Ryosuke Ericksen, PA-C    Family History No family history on file.  Social History Social History  Substance Use Topics  . Smoking status: Never Smoker  . Smokeless tobacco: Current User    Types: Chew  . Alcohol use 1.2 oz/week    2 Cans of beer per week     Comment: occassionally     Allergies   Keflex [cephalexin]   Review of Systems Review of Systems  All other systems reviewed and are negative.   Physical Exam Updated Vital Signs BP 116/77 (BP Location: Right Arm)   Pulse 67   Temp 97.5 F (36.4 C) (Oral)   Resp 16   SpO2 99%    Physical Exam  Constitutional: He is oriented to person, place, and time. He appears well-developed and well-nourished.  HENT:  Head: Normocephalic and atraumatic.  Eyes: Conjunctivae are normal. Pupils are equal, round, and reactive to light. Right eye exhibits no discharge. Left eye exhibits no discharge. No scleral icterus.  Neck: Normal range of motion. No JVD present. No tracheal deviation present.  Pulmonary/Chest: Effort normal. No stridor.  Abdominal: Soft. Normal appearance and bowel sounds are normal. There is no hepatosplenomegaly, splenomegaly or hepatomegaly. No hernia. Hernia confirmed negative in the ventral area, confirmed negative in the right inguinal area and confirmed negative in the left inguinal area.  TTP RLQ  Neurological: He is alert and oriented to person, place, and time. Coordination normal.  Psychiatric: He has a normal mood and affect. His behavior is normal. Judgment and thought content normal.  Nursing note and vitals reviewed.   ED Treatments / Results  Labs (all labs ordered are listed, but only abnormal results are displayed) Labs Reviewed  COMPREHENSIVE METABOLIC PANEL - Abnormal; Notable for the following:       Result Value   Glucose, Bld 114 (*)    All other components within normal limits  URINALYSIS, ROUTINE W REFLEX MICROSCOPIC (NOT AT [facility]) - Abnormal; Notable for the following:    Color, Urine AMBER (*)    APPearance CLOUDY (*)  Hgb urine dipstick LARGE (*)    Protein, ur 30 (*)    All other components within normal limits  URINE MICROSCOPIC-ADD ON - Abnormal; Notable for the following:    Squamous Epithelial / LPF 0-5 (*)    Bacteria, UA FEW (*)    Crystals CA OXALATE CRYSTALS (*)    All other components within normal limits  CBC WITH DIFFERENTIAL/PLATELET  LIPASE, BLOOD    EKG  EKG Interpretation None       Radiology Ct Abdomen Pelvis W Contrast  Result Date: 09/22/2016 CLINICAL DATA:  Patient with 10 out of 10  abdominal pain. Nausea and vomiting. History of renal stones. EXAM: CT ABDOMEN AND PELVIS WITH CONTRAST TECHNIQUE: Multidetector CT imaging of the abdomen and pelvis was performed using the standard protocol following bolus administration of intravenous contrast. CONTRAST:  ISOVUE-300 IOPAMIDOL (ISOVUE-300) INJECTION 61% COMPARISON:  None. FINDINGS: Lower chest: Normal heart size. No pericardial effusion. Subpleural ground-glass and consolidative opacities within the lower lobes bilaterally. No pleural effusion. Hepatobiliary: Liver is normal in size and contour. No focal hepatic lesion is identified. Gallbladder is unremarkable. Pancreas: Unremarkable Spleen: Unremarkable Adrenals/Urinary Tract: The adrenal glands are normal. Kidneys enhance symmetrically with contrast. Mild left hydronephrosis. There is an obstructing 8 mm stone with mid left ureter (image 45; series 2). Urinary bladder is unremarkable. Stomach/Bowel: No abnormal bowel wall thickening or evidence for bowel obstruction. No free fluid or free intraperitoneal air. Normal morphology of the stomach. Vascular/Lymphatic: Normal caliber abdominal aorta. No retroperitoneal lymphadenopathy. Reproductive: Prostate unremarkable. Other: None. Musculoskeletal: No aggressive or acute appearing osseous lesions. IMPRESSION: There is an obstructing 8 mm stone within the mid left ureter resulting in mild left hydronephrosis. No additional renal stones are identified. Electronically Signed   By: Annia Belt M.D.   On: 09/22/2016 09:03    Procedures Procedures (including critical care time)  Medications Ordered in ED Medications  tamsulosin (FLOMAX) capsule 0.4 mg (0.4 mg Oral Given 09/22/16 1031)  morphine 4 MG/ML injection 4 mg (4 mg Intravenous Given 09/22/16 0654)  iopamidol (ISOVUE-300) 61 % injection 100 mL (100 mLs Intravenous Contrast Given 09/22/16 0819)  ketorolac (TORADOL) 30 MG/ML injection 30 mg (30 mg Intravenous Given 09/22/16 1031)      Initial Impression / Assessment and Plan / ED Course  I have reviewed the triage vital signs and the nursing notes.  Pertinent labs & imaging results that were available during my care of the patient were reviewed by me and considered in my medical decision making (see chart for details).  Clinical Course      Final Clinical Impressions(s) / ED Diagnoses   Final diagnoses:  Nephrolithiasis   Labs:CBC, CMP, lipase  Imaging: CT abdomen and pelvis  Consults:  Therapeutics: Morphine, Toradol, Flomax  Discharge Meds: Percocet, Flomax  Assessment/Plan:  35 year old male presents today with complaints of right lower quadrant pain. Patient has a millimeter obstructing stone in the right ureter. Mild hydronephrosis, no signs of infectious etiology. No nausea or vomiting here. Patient's pain managed here in the ED without difficulty. Patient given Flomax, he'll be discharged home on Flomax, Percocet, urology follow-up. He is instructed to return immediately if he expresses any new or worsening signs or symptoms, or pain is unresolved with oral medications at home. Patient verbalizes understanding and agreement to today's plan had no further questions or concerns.  New Prescriptions New Prescriptions   OXYCODONE-ACETAMINOPHEN (PERCOCET/ROXICET) 5-325 MG TABLET    Take 2 tablets by mouth every 4 (four) hours  as needed for severe pain.   TAMSULOSIN (FLOMAX) 0.4 MG CAPS CAPSULE    Take 1 capsule (0.4 mg total) by mouth daily.     Eyvonne MechanicJeffrey Parag Dorton, PA-C 09/22/16 1107    Raeford RazorStephen Kohut, MD 09/24/16 [phone]

## 2016-09-22 NOTE — ED Triage Notes (Signed)
Per EMS, pt is from home with complaints of 10/10 sharp abdominal pain. Pt stated that pain woke him up from sleep around 5 am this morning. Pt reports N/V and denies diarrhea at this time. Pt received 4 mg of zofran and 200 mcg of fentanyl and pain decreased to 6/10. Pt has a hx of kidney stones.

## 2016-09-22 NOTE — ED Notes (Signed)
Bed: WA04 Expected date:  Expected time:  Means of arrival:  Comments: EMS-abdominal pain 

## 2016-09-22 NOTE — Discharge Instructions (Signed)
Please read attached information. If you experience any new or worsening signs or symptoms please return to the emergency room for evaluation. Please follow-up with your primary care provider or specialist as discussed. Please use medication prescribed only as directed and discontinue taking if you have any concerning signs or symptoms.   °

## 2016-10-25 ENCOUNTER — Emergency Department (HOSPITAL_COMMUNITY): Payer: BLUE CROSS/BLUE SHIELD

## 2016-10-25 ENCOUNTER — Encounter (HOSPITAL_COMMUNITY): Payer: Self-pay

## 2016-10-25 ENCOUNTER — Emergency Department (HOSPITAL_COMMUNITY)
Admission: EM | Admit: 2016-10-25 | Discharge: 2016-10-25 | Disposition: A | Discharge status: Home / Self Care | Payer: BLUE CROSS/BLUE SHIELD | Attending: Emergency Medicine | Admitting: Emergency Medicine

## 2016-10-25 DIAGNOSIS — R1084 Generalized abdominal pain: Secondary | ICD-10-CM | POA: Insufficient documentation

## 2016-10-25 DIAGNOSIS — R109 Unspecified abdominal pain: Secondary | ICD-10-CM

## 2016-10-25 LAB — URINALYSIS, ROUTINE W REFLEX MICROSCOPIC
BILIRUBIN URINE: NEGATIVE
Glucose, UA: NEGATIVE mg/dL
KETONES UR: NEGATIVE mg/dL
LEUKOCYTES UA: NEGATIVE
NITRITE: NEGATIVE
Protein, ur: NEGATIVE mg/dL
SPECIFIC GRAVITY, URINE: 1.005 (ref 1.005–1.030)
SQUAMOUS EPITHELIAL / LPF: NONE SEEN
pH: 7 (ref 5.0–8.0)

## 2016-10-25 LAB — CBC WITH DIFFERENTIAL/PLATELET
BASOS PCT: 1 %
Basophils Absolute: 0 10*3/uL (ref 0.0–0.1)
EOS ABS: 0.1 10*3/uL (ref 0.0–0.7)
EOS PCT: 1 %
HCT: 40.8 % (ref 39.0–52.0)
HEMOGLOBIN: 14.3 g/dL (ref 13.0–17.0)
LYMPHS ABS: 1.9 10*3/uL (ref 0.7–4.0)
Lymphocytes Relative: 30 %
MCH: 28.9 pg (ref 26.0–34.0)
MCHC: 35 g/dL (ref 30.0–36.0)
MCV: 82.6 fL (ref 78.0–100.0)
Monocytes Absolute: 0.5 10*3/uL (ref 0.1–1.0)
Monocytes Relative: 8 %
NEUTROS PCT: 60 %
Neutro Abs: 4 10*3/uL (ref 1.7–7.7)
PLATELETS: 266 10*3/uL (ref 150–400)
RBC: 4.94 MIL/uL (ref 4.22–5.81)
RDW: 12.5 % (ref 11.5–15.5)
WBC: 6.5 10*3/uL (ref 4.0–10.5)

## 2016-10-25 LAB — COMPREHENSIVE METABOLIC PANEL
ALBUMIN: 4.7 g/dL (ref 3.5–5.0)
ALT: 14 U/L — AB (ref 17–63)
ANION GAP: 8 (ref 5–15)
AST: 17 U/L (ref 15–41)
Alkaline Phosphatase: 47 U/L (ref 38–126)
BUN: 10 mg/dL (ref 6–20)
CHLORIDE: 101 mmol/L (ref 101–111)
CO2: 27 mmol/L (ref 22–32)
Calcium: 9.4 mg/dL (ref 8.9–10.3)
Creatinine, Ser: 0.83 mg/dL (ref 0.61–1.24)
GFR calc non Af Amer: >60 mL/min (ref >60)
GLUCOSE: 101 mg/dL — AB (ref 65–99)
Potassium: 3.4 mmol/L — ABNORMAL LOW (ref 3.5–5.1)
SODIUM: 136 mmol/L (ref 135–145)
Total Bilirubin: 1.5 mg/dL — ABNORMAL HIGH (ref 0.3–1.2)
Total Protein: 7.5 g/dL (ref 6.5–8.1)

## 2016-10-25 LAB — LIPASE, BLOOD: Lipase: 28 U/L (ref 11–51)

## 2016-10-25 MED ORDER — IOPAMIDOL (ISOVUE-300) INJECTION 61%
INTRAVENOUS | Status: AC
  Filled 2016-10-25: qty 100

## 2016-10-25 MED ORDER — IOPAMIDOL (ISOVUE-300) INJECTION 61%
100.0000 mL | Freq: Once | INTRAVENOUS | Status: AC | PRN
  Administered 2016-10-25: 100 mL via INTRAVENOUS

## 2016-10-25 MED ORDER — SODIUM CHLORIDE 0.9 % IV SOLN
INTRAVENOUS | Status: DC
  Administered 2016-10-25: 18:00:00 via INTRAVENOUS

## 2016-10-25 MED ORDER — ONDANSETRON HCL 4 MG/2ML IJ SOLN
4.0000 mg | Freq: Once | INTRAMUSCULAR | Status: AC
  Administered 2016-10-25: 4 mg via INTRAVENOUS
  Filled 2016-10-25: qty 2

## 2016-10-25 MED ORDER — SODIUM CHLORIDE 0.9 % IV BOLUS (SEPSIS)
1000.0000 mL | Freq: Once | INTRAVENOUS | Status: AC
  Administered 2016-10-25: 1000 mL via INTRAVENOUS

## 2016-10-25 MED ORDER — IOPAMIDOL (ISOVUE-300) INJECTION 61%
INTRAVENOUS | Status: AC
  Filled 2016-10-25: qty 30

## 2016-10-25 MED ORDER — IOPAMIDOL (ISOVUE-300) INJECTION 61%
30.0000 mL | Freq: Once | INTRAVENOUS | Status: DC | PRN
Start: 2016-10-25 — End: 2016-10-26

## 2016-10-25 NOTE — ED Provider Notes (Signed)
[facility]-EMERGENCY DEPT Provider Note   CSN: [id number] Arrival date & time: 10/25/16  1646     History   Chief Complaint Chief Complaint  Patient presents with  . Constipation  . Emesis    HPI Micheal Malone is a 35 y.o. male.  35 year old male with history of kidney stones, last seen here about a month ago and diagnosed with an 8 mm stone presents with worsening diffuse abdominal discomfort with some bilious emesis today. States his last bowel movement was a week and half ago. Denies any fever or chills. Denies any hematuria or dysuria. Pain is been persistent and nothing makes it better. No treatment use prior to arrival      Past Medical History:  Diagnosis Date  . Renal stones     There are no active problems to display for this patient.   History reviewed. No pertinent surgical history.     Home Medications    Prior to Admission medications   Medication Sig Start Date End Date Taking? Authorizing Provider  psyllium (REGULOID) 0.52 g capsule Take 0.52 g by mouth 2 (two) times daily.   Yes Historical Provider, MD  oxyCODONE-acetaminophen (PERCOCET/ROXICET) 5-325 MG tablet Take 2 tablets by mouth every 4 (four) hours as needed for severe pain. Patient not taking: Reported on 10/25/2016 09/22/16   Eyvonne MechanicJeffrey Hedges, PA-C  tamsulosin (FLOMAX) 0.4 MG CAPS capsule Take 1 capsule (0.4 mg total) by mouth daily. Patient not taking: Reported on 10/25/2016 09/22/16   Eyvonne MechanicJeffrey Hedges, PA-C    Family History History reviewed. No pertinent family history.  Social History Social History  Substance Use Topics  . Smoking status: Never Smoker  . Smokeless tobacco: Former NeurosurgeonUser  . Alcohol use 1.2 oz/week    2 Cans of beer per week     Comment: occassionally     Allergies   Keflex [cephalexin]   Review of Systems Review of Systems  All other systems reviewed and are negative.    Physical Exam Updated Vital Signs BP 145/92 (BP Location: Right Arm)   Pulse 87    Temp 98.3 F (36.8 C) (Oral)   Resp 16   Ht 5\' 11"  (1.803 m)   Wt 75.8 kg   SpO2 96%   BMI 23.29 kg/m   Physical Exam  Constitutional: He is oriented to person, place, and time. He appears well-developed and well-nourished.  Non-toxic appearance. No distress.  HENT:  Head: Normocephalic and atraumatic.  Eyes: Conjunctivae, EOM and lids are normal. Pupils are equal, round, and reactive to light.  Neck: Normal range of motion. Neck supple. No tracheal deviation present. No thyroid mass present.  Cardiovascular: Normal rate, regular rhythm and normal heart sounds.  Exam reveals no gallop.   No murmur heard. Pulmonary/Chest: Effort normal and breath sounds normal. No stridor. No respiratory distress. He has no decreased breath sounds. He has no wheezes. He has no rhonchi. He has no rales.  Abdominal: Soft. Normal appearance and bowel sounds are normal. He exhibits no distension. There is no tenderness. There is no rebound and no CVA tenderness.  Genitourinary: Right testis shows no mass and no tenderness. Left testis shows no mass and no tenderness.  Musculoskeletal: Normal range of motion. He exhibits no edema or tenderness.  Neurological: He is alert and oriented to person, place, and time. He has normal strength. No cranial nerve deficit or sensory deficit. GCS eye subscore is 4. GCS verbal subscore is 5. GCS motor subscore is 6.  Skin: Skin is  warm and dry. No abrasion and no rash noted.  Psychiatric: He has a normal mood and affect. His speech is normal and behavior is normal.  Nursing note and vitals reviewed.    ED Treatments / Results  Labs (all labs ordered are listed, but only abnormal results are displayed) Labs Reviewed  URINALYSIS, ROUTINE W REFLEX MICROSCOPIC - Abnormal; Notable for the following:       Result Value   Hgb urine dipstick LARGE (*)    Bacteria, UA MANY (*)    All other components within normal limits  LIPASE, BLOOD  COMPREHENSIVE METABOLIC PANEL  CBC     EKG  EKG Interpretation None       Radiology No results found.  Procedures Procedures (including critical care time)  Medications Ordered in ED Medications - No data to display   Initial Impression / Assessment and Plan / ED Course  I have reviewed the triage vital signs and the nursing notes.  Pertinent labs & imaging results that were available during my care of the patient were reviewed by me and considered in my medical decision making (see chart for details).  Clinical Course     Abdominal CT was negative for acute process. Abdominal exam at time of discharge remained stable. Will be given GI referral  Final Clinical Impressions(s) / ED Diagnoses   Final diagnoses:  None    New Prescriptions New Prescriptions   No medications on file     Lorre NickAnthony Shayma Pfefferle, MD 10/25/16 2222

## 2016-10-25 NOTE — ED Notes (Signed)
Patient transported to CT 

## 2016-10-25 NOTE — ED Triage Notes (Addendum)
PT C/O ABDOMINAL PAIN, N/V, AND CONSTIPATION FOR 1 1/2 WEEKS. PT STS HE WAS RECENTLY SEEN AND TREATED FOR A KIDNEY STONE 3 WEEKS AGO. HE STS THIS WAS HIS 9TH KIDNEY STONE, SO HE STARTED TAKING MIRALAX JUST IN CASE, AND HE HAS NOT HAD A BM. PT STS THE PAIN IS RADIATING TO THE LEFT SIDE OF HIS BACK AND DOWN TO THE LEFT TESTICLE.

## 2016-10-25 NOTE — ED Notes (Signed)
Patient was alert, oriented and stable upon discharge. RN went over AVS and patient had no further questions.  

## 2017-01-24 ENCOUNTER — Emergency Department (HOSPITAL_COMMUNITY)
Admission: EM | Admit: 2017-01-24 | Discharge: 2017-01-24 | Disposition: A | Discharge status: Home / Self Care | Payer: BLUE CROSS/BLUE SHIELD | Attending: Emergency Medicine | Admitting: Emergency Medicine

## 2017-01-24 ENCOUNTER — Encounter (HOSPITAL_COMMUNITY): Payer: Self-pay

## 2017-01-24 DIAGNOSIS — R339 Retention of urine, unspecified: Secondary | ICD-10-CM | POA: Diagnosis not present

## 2017-01-24 LAB — URINALYSIS, ROUTINE W REFLEX MICROSCOPIC
BILIRUBIN URINE: NEGATIVE
Glucose, UA: NEGATIVE mg/dL
Hgb urine dipstick: NEGATIVE
Ketones, ur: NEGATIVE mg/dL
LEUKOCYTES UA: NEGATIVE
NITRITE: NEGATIVE
PH: 7 (ref 5.0–8.0)
Protein, ur: NEGATIVE mg/dL
SPECIFIC GRAVITY, URINE: 1.006 (ref 1.005–1.030)

## 2017-01-24 MED ORDER — PHENAZOPYRIDINE HCL 200 MG PO TABS: 200.0000 mg | ORAL_TABLET | Freq: Three times a day (TID) | ORAL | 0 refills | Status: DC

## 2017-01-24 NOTE — ED Triage Notes (Signed)
Patient states he last urinated this AM, but was only able to urinate a small amount. Patient states he has a known 8 mm kidney stone.

## 2017-01-24 NOTE — ED Notes (Signed)
Bed: WA07 Expected date:  Expected time:  Means of arrival:  Comments: 

## 2017-01-24 NOTE — ED Notes (Signed)
Bladder scan: 435 mL

## 2017-01-24 NOTE — Discharge Instructions (Signed)
Please take the medication to help with irritation. Please follow up with urology. Call today and tell them that you have a foley inserted and that they need to see you in the office.

## 2017-01-24 NOTE — ED Provider Notes (Signed)
[facility]-EMERGENCY DEPT Provider Note   CSN: [id number] Arrival date & time: 01/24/17  1024  By signing my name below, I, Sonum Patel, attest that this documentation has been prepared under the direction and in the presence of Rise Mu, PA-C. Electronically Signed: Sonum Patel, Scribe. 01/24/17. 11:35 AM.  History   Chief Complaint Chief Complaint  Patient presents with  . Urinary Retention   The history is provided by the patient. No language interpreter was used.     HPI Comments: Micheal Malone is a 36 y.o. male who presents to the Emergency Department complaining of urinary retention that began this morning. Patient states he was last able to void this morning but only produced a small amount of dark urine. He states he has consumed 2 cups of coffee and 2 bottles of water this morning and has the urge to void but is not able to. He has associated suprapubic discomfort due to the urge to void but denies abdominal pain. He has a known 8mm kidney stone in his bladder but cannot afford to see a urologist to have it treated. He denies similar symptoms in the past. He denies history of frequent STDs or trauma to the genitals. He denies testicular pain, scrotal swelling, fever, nausea, vomiting, bowel/bladder incontinence, saddle anesthesia.   Past Medical History:  Diagnosis Date  . Renal stones     There are no active problems to display for this patient.   History reviewed. No pertinent surgical history.     Home Medications    Prior to Admission medications   Medication Sig Start Date End Date Taking? Authorizing Provider  oxyCODONE-acetaminophen (PERCOCET/ROXICET) 5-325 MG tablet Take 2 tablets by mouth every 4 (four) hours as needed for severe pain. Patient not taking: Reported on 10/25/2016 09/22/16   Eyvonne Mechanic, PA-C  psyllium (REGULOID) 0.52 g capsule Take 0.52 g by mouth 2 (two) times daily.    Historical Provider, MD  tamsulosin (FLOMAX) 0.4 MG CAPS capsule  Take 1 capsule (0.4 mg total) by mouth daily. Patient not taking: Reported on 10/25/2016 09/22/16   Eyvonne Mechanic, PA-C    Family History Family History  Problem Relation Age of Onset  . Cerebral aneurysm Father   . Heart failure Father     Social History Social History  Substance Use Topics  . Smoking status: Never Smoker  . Smokeless tobacco: Former Neurosurgeon    Types: Snuff  . Alcohol use 1.2 oz/week    2 Cans of beer per week     Comment: occassionally     Allergies   Keflex [cephalexin]   Review of Systems Review of Systems  Constitutional: Negative for fever.  Gastrointestinal: Negative for abdominal pain, nausea and vomiting.  Genitourinary: Positive for difficulty urinating. Negative for scrotal swelling and testicular pain.  Neurological: Negative for numbness.  All other systems reviewed and are negative.    Physical Exam Updated Vital Signs BP (!) 139/96 (BP Location: Right Arm)   Pulse 72   Temp 97.9 F (36.6 C) (Oral)   Resp 18   Ht 5\' 11"  (1.803 m)   Wt 172 lb (78 kg)   SpO2 100%   BMI 23.99 kg/m   Physical Exam  Constitutional: He is oriented to person, place, and time. He appears well-developed and well-nourished. No distress.  Non toxic appearing. No acute distress  HENT:  Head: Normocephalic and atraumatic.  Neck: Normal range of motion. No tracheal deviation present.  Cardiovascular: Normal rate, regular rhythm and normal  heart sounds.   Pulmonary/Chest: Effort normal and breath sounds normal. No respiratory distress. He has no wheezes. He has no rales.  Abdominal: Soft. There is no tenderness. There is no rigidity, no rebound, no guarding, no CVA tenderness and no tenderness at McBurney's point.  Suprapubic pressure. No rebound, guarding, or rigidity. Negative McBurney's point. No CVA tenderness.  Genitourinary: Rectum normal and penis normal. No penile tenderness.  Genitourinary Comments: Chaperone present for exam: Tolerate without  difficulty. No tesitcular pain or swelling. No penile pain or discharge. No adenopathy. Rectal exam: normal rectal tone. Slightly enlarged prostate without tenderness to palpation.  Neurological: He is alert and oriented to person, place, and time.  Skin: Skin is warm and dry.  Psychiatric: He has a normal mood and affect. His behavior is normal.  Nursing note and vitals reviewed.    ED Treatments / Results  DIAGNOSTIC STUDIES: Oxygen Saturation is 100% on RA, normal by my interpretation.    COORDINATION OF CARE: 11:30 AM Discussed treatment plan with pt at bedside and pt agreed to plan.   Labs (all labs ordered are listed, but only abnormal results are displayed) Labs Reviewed  URINALYSIS, ROUTINE W REFLEX MICROSCOPIC    EKG  EKG Interpretation None       Radiology No results found.  Procedures Procedures (including critical care time)  Medications Ordered in ED Medications - No data to display   Initial Impression / Assessment and Plan / ED Course  I have reviewed the triage vital signs and the nursing notes.  Pertinent labs & imaging results that were available during my care of the patient were reviewed by me and considered in my medical decision making (see chart for details).     Pt presents to the ED with urinary retention.The patient does have a known kidney stone 8 mm and his bladder. Bladder scan showed 435 ML's of urine. Mildly tender to palpation of the suprapubic region. Foley catheter was inserted with 400 mL of urine returned. Patient felt significant improvement in his bladder pressure. Urine shows no signs of infection. Prostate exam reveals no significant enlarged prostate and or tenderness concerning for prostatitis. Patient is afebrile and not tachycardic. He is nontoxic appearing. Patient denies any back pain. No focal neuro deficits. Normal rectal tone. No trauma concerning for cauda equina. Spoke with Dr. Marlou PorchHerrick with urology who will see patient in  the office tomorrow. Have sent patient home with leg bag. Have given him Pyridium for irritation. Acute urinary retention like because by blockage of stone in the bladder. Patient verbalized understanding the plan of care will call tomorrow for an appointment with urology. All questions were answered prior to discharge. He was hemodynamically stable in no acute distress. Patient was discussed with Dr. Anitra LauthPlunkett who is agreeable the above plan. Final Clinical Impressions(s) / ED Diagnoses   Final diagnoses:  Urinary retention    New Prescriptions Discharge Medication List as of 01/24/2017  2:07 PM    START taking these medications   Details  phenazopyridine (PYRIDIUM) 200 MG tablet Take 1 tablet (200 mg total) by mouth 3 (three) times daily., Starting Tue 01/24/2017, Print       I personally performed the services described in this documentation, which was scribed in my presence. The recorded information has been reviewed and is accurate.     Rise MuKenneth T Leaphart, PA-C 01/25/17 0945    Gwyneth SproutWhitney Plunkett, MD 01/25/17 1630

## 2017-02-15 ENCOUNTER — Encounter (HOSPITAL_BASED_OUTPATIENT_CLINIC_OR_DEPARTMENT_OTHER): Payer: Self-pay | Admitting: *Deleted

## 2017-02-15 ENCOUNTER — Other Ambulatory Visit: Payer: Self-pay | Admitting: Urology

## 2017-02-15 NOTE — Progress Notes (Signed)
NPO AFTER MN.  ARRIVE AT 0715.  NEEDS HG. 

## 2017-02-19 NOTE — H&P (Signed)
HPI: Micheal Malone is a 36 year-old male with a left ureteral calculus.  The patient's stone is one his left side. This is his first kidney stone. He is not currently having flank pain, back pain, groin pain, nausea, vomiting, fever or chills. He does have a burning sensation when he urinates. He has not caught a stone in his urine strainer since his symptoms began.   He has never had surgical treatment for calculi in the past.   01/10/17: A CT scan on 09/22/16 revealed a single 6 mm stone in the proximal left ureter. No other right or left renal calculi were present A CT scan done on 10/25/16 revealed the left ureteral stone appeared to have migrated back up into the area of the left renal pelvis.  He went to the emergency room recently because he felt like he was unable to urinate. A Foley catheter was inserted and he said he was having pain and leaking urine around the catheter suggestive of bladder spasms. He therefore removed his own catheter 24 hours later. He has not had any hematuria. He has noted some increased frequency and urgency. He was not started on alpha-blocker therapy. He has no prior history of stones. He said that the ER physician told and the stone was too large to pass and also that it was in his bladder neither of which is true.   Interval history 02/15/17: He has been doing well without any significant pain but has not seen his stone passed. The alpha-blocker that he has been taking results in a significant feeling of urgency.     ALLERGIES: keflex    MEDICATIONS: Diazepam 10 mg tablet 2 tablet PO As Directed Take both pills approximately 45 minutes prior to scheduled procedure on an empty stomach.     GU PSH: None   NON-GU PSH: None   GU PMH: Calculus Ureter, We discussed the management of urinary stones. These options include observation, ureteroscopy, shockwave lithotripsy, and PCNL. We discussed which options are relevant to these particular stones. We discussed the  natural history of stones as well as the complications of untreated stones and the impact on quality of life without treatment as well as with each of the above listed treatments. We also discussed the efficacy of each treatment in its ability to clear the stone burden. With any of these management options I discussed the signs and symptoms of infection and the need for emergent treatment should these be experienced. For each option we discussed the ability of each procedure to clear the patient of their stone burden. For observation I described the risks which include but are not limited to silent renal damage, life-threatening infection, need for emergent surgery, failure to pass stone, and pain. For ureteroscopy I described the risks which include heart attack, stroke, pulmonary embolus, death, bleeding, infection, damage to contiguous structures, positioning injury, ureteral stricture, ureteral avulsion, ureteral injury, need for ureteral stent, inability to perform ureteroscopy, need for an interval procedure, inability to clear stone burden, stent discomfort and pain. For shockwave lithotripsy I described the risks which include arrhythmia, kidney contusion, kidney hemorrhage, need for transfusion, long-term risk of diabetes or hypertension, back discomfort, flank ecchymosis, flank abrasion, inability to break up stone, inability to pass stone fragments, Steinstrasse, infection associated with obstructing stones, need for different surgical procedure and possible need for repeat shockwave lithotripsy. - 01/30/2017    NON-GU PMH: Encounter for other contraceptive management, We discussed the procedure today, going over the incision used and  the fact that I use the no-scalpel, no-needle approach. We went over the pertinent anatomy of the scrotum and vasa. I have discussed with the patient the single midline incision that I use and the fact that the procedure is office-based and takes about 10 to 15 minutes. We  discussed the fact that the patient must absolutely be sure that he does not want to father a child under any circumstances and was counseled to talk with his partner regarding this decision. We then discussed the potential risks and complications of this procedure, including but not limited to epididymitis with typically transient discomfort and swelling after the procedure, which can occur on one or both sides; sperm granuloma, which was described in detail as being caused by the presence of some sperm leaking out from the severed ends of the vasa, causing an inflammatory reaction and oftentimes resulting in a small round area of scar that may persist; hematoma and bleeding into the scrotum, which can be significant and possibly require a second incision to drain the accumulated blood, as well as how to prevent this by decreased activity level. We discussed superficial skin infection, which is rare, and abscess formation, which is exceedingly rare and may require drainage as well. Finally, we went over recanalization, which can occur in 1 in 2000 cases and how to determine this and prevent this from occurring. The patient was given written information regarding all of these risks and complications. Also, informed of preoperative preparation of shaving the scrotum, and given written information indicating that immediately after the procedure continued birth control needs to be used until 2 semen specimens are noted to be free of sperm. - 01/30/2017    FAMILY HISTORY: cerebral aneurysm - Father heart failure - Father   SOCIAL HISTORY: Marital Status: Single Current Smoking Status: Patient has never smoked.   Tobacco Use Assessment Completed: Used Tobacco in last 30 days? Social Drinker.  Drinks 2 caffeinated drinks per day.    REVIEW OF SYSTEMS:    GU Review Male:   Patient reports hard to postpone urination. Patient denies trouble starting your stream, leakage of urine, get up at night to urinate,  penile pain, frequent urination, stream starts and stops, erection problems, have to strain to urinate , and burning/ pain with urination.  Gastrointestinal (Upper):   Patient denies nausea, vomiting, and indigestion/ heartburn.  Gastrointestinal (Lower):   Patient reports constipation. Patient denies diarrhea.  Constitutional:   Patient denies fever, night sweats, weight loss, and fatigue.  Skin:   Patient denies skin rash/ lesion and itching.  Eyes:   Patient denies blurred vision and double vision.  Ears/ Nose/ Throat:   Patient denies sore throat and sinus problems.  Hematologic/Lymphatic:   Patient denies swollen glands and easy bruising.  Cardiovascular:   Patient denies leg swelling and chest pains.  Respiratory:   Patient denies cough and shortness of breath.  Endocrine:   Patient denies excessive thirst.  Musculoskeletal:   Patient denies back pain and joint pain.  Neurological:   Patient denies headaches and dizziness.  Psychologic:   Patient denies depression and anxiety.   VITAL SIGNS:    BP 121/84 mmHg  Pulse 67 /min   GU PHYSICAL EXAMINATION:    Scrotum: No lesions. No edema. No cysts. No warts.  Epididymides: Right: no spermatocele, no masses, no cysts, no tenderness, no induration, no enlargement. Left: no spermatocele, no masses, no cysts, no tenderness, no induration, no enlargement.  Testes: No tenderness, no swelling, no enlargement  left testes. No tenderness, no swelling, no enlargement right testes. Normal location left testes. Normal location right testes. No mass, no cyst, no varicocele, no hydrocele left testes. No mass, no cyst, no varicocele, no hydrocele right testes.  Urethral Meatus: Normal size. No lesion, no wart, no discharge, no polyp. Normal location.  Penis: Circumcised, no warts, no cracks. No dorsal Peyronie's plaques, no left corporal Peyronie's plaques, no right corporal Peyronie's plaques, no scarring, no warts. No balanitis, no meatal stenosis.    MULTI-SYSTEM PHYSICAL EXAMINATION:    Constitutional: Well-nourished. No physical deformities. Normally developed. Good grooming.  Neck: Neck symmetrical, not swollen. Normal tracheal position.  Respiratory: No labored breathing, no use of accessory muscles.   Cardiovascular: Normal temperature, normal extremity pulses, no swelling, no varicosities.  Lymphatic: No enlargement of neck, axillae, groin.  Skin: No paleness, no jaundice, no cyanosis. No lesion, no ulcer, no rash.  Neurologic / Psychiatric: Oriented to time, oriented to place, oriented to person. No depression, no anxiety, no agitation.  Gastrointestinal: No mass, no tenderness, no rigidity, non obese abdomen.  Eyes: Normal conjunctivae. Normal eyelids.  Ears, Nose, Mouth, and Throat: Left ear no scars, no lesions, no masses. Right ear no scars, no lesions, no masses. Nose no scars, no lesions, no masses. Normal hearing. Normal lips.  Musculoskeletal: Normal gait and station of head and neck.     PAST DATA REVIEWED:  Source Of History:  Patient  Records Review:   Previous Patient Records, POC Tool  X-Ray Review: KUB: Reviewed Films. Previous KUB images were reviewed and compared with today's study.    PROCEDURES:         KUB - [id number]  A single view of the abdomen is obtained.      His stone remains present in the distal left ureter and appears unchanged in location.         Urinalysis Dipstick Dipstick Cont'd  Color: Yellow Bilirubin: Neg  Appearance: Clear Ketones: Neg  Specific Gravity: 1.025 Blood: Neg  pH: 6.0 Protein: Neg  Glucose: Neg Urobilinogen: 0.2    Nitrites: Neg    Leukocyte Esterase: Neg    ASSESSMENT/PLAN:     ICD-10 Details  1 GU:   Calculus Ureter - N20.1 Left, Acute - His stone has not progressed. We discussed ureteroscopy versus lithotripsy in detail today. He would like to proceed with ureteroscopy and I went over the procedure with him in detail including its risks and complications, the  probability of success, the outpatient nature of the procedure, the need for a stent, and anticipated postoperative course. He understands and is likely proceed.  2 NON-GU:   Encounter for other contraceptive management - Z30.8 Bilateral, Stable - He would like to proceed with vasectomy at the time of his ureteroscopy.

## 2017-02-20 ENCOUNTER — Ambulatory Visit (HOSPITAL_BASED_OUTPATIENT_CLINIC_OR_DEPARTMENT_OTHER): Payer: BLUE CROSS/BLUE SHIELD | Admitting: Anesthesiology

## 2017-02-20 ENCOUNTER — Encounter (HOSPITAL_BASED_OUTPATIENT_CLINIC_OR_DEPARTMENT_OTHER)
Admission: RE | Disposition: A | Discharge status: Home / Self Care | Payer: Self-pay | Source: Ambulatory Visit | Attending: Urology

## 2017-02-20 ENCOUNTER — Encounter (HOSPITAL_BASED_OUTPATIENT_CLINIC_OR_DEPARTMENT_OTHER): Payer: Self-pay

## 2017-02-20 ENCOUNTER — Ambulatory Visit (HOSPITAL_BASED_OUTPATIENT_CLINIC_OR_DEPARTMENT_OTHER)
Admission: RE | Admit: 2017-02-20 | Discharge: 2017-02-20 | Disposition: A | Discharge status: Home / Self Care | Payer: BLUE CROSS/BLUE SHIELD | Source: Ambulatory Visit | Attending: Urology | Admitting: Urology

## 2017-02-20 DIAGNOSIS — N201 Calculus of ureter: Secondary | ICD-10-CM | POA: Diagnosis not present

## 2017-02-20 DIAGNOSIS — Z302 Encounter for sterilization: Secondary | ICD-10-CM | POA: Diagnosis not present

## 2017-02-20 HISTORY — PX: VASECTOMY: SHX75

## 2017-02-20 HISTORY — DX: Calculus of ureter: N20.1

## 2017-02-20 HISTORY — DX: Personal history of urinary calculi: Z87.442

## 2017-02-20 HISTORY — DX: Presence of spectacles and contact lenses: Z97.3

## 2017-02-20 HISTORY — PX: CYSTOSCOPY/URETEROSCOPY/HOLMIUM LASER/STENT PLACEMENT: SHX6546

## 2017-02-20 LAB — POCT I-STAT 4, (NA,K, GLUC, HGB,HCT)
Glucose, Bld: 102 mg/dL — ABNORMAL HIGH (ref 65–99)
HCT: 42 % (ref 39.0–52.0)
Hemoglobin: 14.3 g/dL (ref 13.0–17.0)
Potassium: 3.9 mmol/L (ref 3.5–5.1)
Sodium: 140 mmol/L (ref 135–145)

## 2017-02-20 SURGERY — CYSTOSCOPY/URETEROSCOPY/HOLMIUM LASER/STENT PLACEMENT
Anesthesia: General | Site: Scrotum | Laterality: Left

## 2017-02-20 MED ORDER — PROMETHAZINE HCL 25 MG/ML IJ SOLN
6.2500 mg | INTRAMUSCULAR | Status: DC | PRN
  Filled 2017-02-20: qty 1

## 2017-02-20 MED ORDER — BUPIVACAINE-EPINEPHRINE 0.5% -1:200000 IJ SOLN
INTRAMUSCULAR | Status: DC | PRN
  Administered 2017-02-20: 10 mL

## 2017-02-20 MED ORDER — IOHEXOL 300 MG/ML  SOLN
INTRAMUSCULAR | Status: DC | PRN
  Administered 2017-02-20: 14 mL

## 2017-02-20 MED ORDER — FENTANYL CITRATE (PF) 100 MCG/2ML IJ SOLN
25.0000 ug | INTRAMUSCULAR | Status: DC | PRN
  Administered 2017-02-20: 50 ug via INTRAVENOUS
  Filled 2017-02-20: qty 1

## 2017-02-20 MED ORDER — CIPROFLOXACIN IN D5W 400 MG/200ML IV SOLN
INTRAVENOUS | Status: AC
  Filled 2017-02-20: qty 200

## 2017-02-20 MED ORDER — MIDAZOLAM HCL 2 MG/2ML IJ SOLN
INTRAMUSCULAR | Status: AC
  Filled 2017-02-20: qty 2

## 2017-02-20 MED ORDER — FENTANYL CITRATE (PF) 100 MCG/2ML IJ SOLN
INTRAMUSCULAR | Status: AC
  Filled 2017-02-20: qty 2

## 2017-02-20 MED ORDER — ONDANSETRON HCL 4 MG/2ML IJ SOLN
INTRAMUSCULAR | Status: DC | PRN
Start: 2017-02-20 — End: 2017-02-20
  Administered 2017-02-20: 4 mg via INTRAVENOUS

## 2017-02-20 MED ORDER — PROPOFOL 10 MG/ML IV BOLUS
INTRAVENOUS | Status: DC | PRN
  Administered 2017-02-20: 200 mg via INTRAVENOUS

## 2017-02-20 MED ORDER — LIDOCAINE 2% (20 MG/ML) 5 ML SYRINGE
INTRAMUSCULAR | Status: AC
  Filled 2017-02-20: qty 5

## 2017-02-20 MED ORDER — LIDOCAINE 2% (20 MG/ML) 5 ML SYRINGE
INTRAMUSCULAR | Status: DC | PRN
  Administered 2017-02-20: 100 mg via INTRAVENOUS

## 2017-02-20 MED ORDER — FENTANYL CITRATE (PF) 100 MCG/2ML IJ SOLN
INTRAMUSCULAR | Status: DC | PRN
  Administered 2017-02-20 (×3): 50 ug via INTRAVENOUS

## 2017-02-20 MED ORDER — KETOROLAC TROMETHAMINE 30 MG/ML IJ SOLN
INTRAMUSCULAR | Status: DC | PRN
  Administered 2017-02-20: 30 mg via INTRAVENOUS

## 2017-02-20 MED ORDER — LIDOCAINE HCL 2 % EX GEL
CUTANEOUS | Status: DC | PRN
  Administered 2017-02-20: 1 via URETHRAL

## 2017-02-20 MED ORDER — SODIUM CHLORIDE 0.9 % IR SOLN
Status: DC | PRN
  Administered 2017-02-20: 1000 mL via INTRAVESICAL
  Administered 2017-02-20: 3000 mL via INTRAVESICAL

## 2017-02-20 MED ORDER — DEXAMETHASONE SODIUM PHOSPHATE 10 MG/ML IJ SOLN
INTRAMUSCULAR | Status: AC
Start: 2017-02-20 — End: 2017-02-20
  Filled 2017-02-20: qty 1

## 2017-02-20 MED ORDER — DEXAMETHASONE SODIUM PHOSPHATE 10 MG/ML IJ SOLN
INTRAMUSCULAR | Status: DC | PRN
  Administered 2017-02-20: 10 mg via INTRAVENOUS

## 2017-02-20 MED ORDER — ONDANSETRON HCL 4 MG/2ML IJ SOLN
INTRAMUSCULAR | Status: AC
  Filled 2017-02-20: qty 2

## 2017-02-20 MED ORDER — PROPOFOL 10 MG/ML IV BOLUS
INTRAVENOUS | Status: AC
  Filled 2017-02-20: qty 40

## 2017-02-20 MED ORDER — MIDAZOLAM HCL 5 MG/5ML IJ SOLN
INTRAMUSCULAR | Status: DC | PRN
  Administered 2017-02-20: 2 mg via INTRAVENOUS

## 2017-02-20 MED ORDER — HYDROCODONE-ACETAMINOPHEN 10-325 MG PO TABS: 1.0000 | ORAL_TABLET | ORAL | 0 refills | Status: DC | PRN

## 2017-02-20 MED ORDER — BACITRACIN-NEOMYCIN-POLYMYXIN 400-5-5000 EX OINT
TOPICAL_OINTMENT | CUTANEOUS | Status: DC | PRN
  Administered 2017-02-20: 1 via TOPICAL

## 2017-02-20 MED ORDER — CIPROFLOXACIN IN D5W 400 MG/200ML IV SOLN
400.0000 mg | INTRAVENOUS | Status: AC
  Administered 2017-02-20: 400 mg via INTRAVENOUS
  Filled 2017-02-20: qty 200

## 2017-02-20 MED ORDER — KETOROLAC TROMETHAMINE 30 MG/ML IJ SOLN
INTRAMUSCULAR | Status: AC
  Filled 2017-02-20: qty 1

## 2017-02-20 MED ORDER — LACTATED RINGERS IV SOLN
INTRAVENOUS | Status: DC
  Administered 2017-02-20: 08:00:00 via INTRAVENOUS
  Filled 2017-02-20: qty 1000

## 2017-02-20 SURGICAL SUPPLY — 34 items
BASKET DAKOTA 1.9FR 11X120 (BASKET) ×4 IMPLANT
BLADE SURG 15 STRL LF DISP TIS (BLADE) ×2 IMPLANT
BLADE SURG 15 STRL SS (BLADE) ×4
BNDG GAUZE ELAST 4 BULKY (GAUZE/BANDAGES/DRESSINGS) ×2 IMPLANT
CLOTH BEACON ORANGE TIMEOUT ST (SAFETY) ×4 IMPLANT
DRSG TEGADERM 2-3/8X2-3/4 SM (GAUZE/BANDAGES/DRESSINGS) ×2 IMPLANT
ELECT NDL TIP 2.8 STRL (NEEDLE) ×2 IMPLANT
ELECT NEEDLE TIP 2.8 STRL (NEEDLE) ×4 IMPLANT
ELECT REM PT RETURN 9FT ADLT (ELECTROSURGICAL) ×4
ELECTRODE REM PT RTRN 9FT ADLT (ELECTROSURGICAL) IMPLANT
FIBER LASER TRAC TIP (UROLOGICAL SUPPLIES) ×2 IMPLANT
GAUZE SPONGE 4X4 12PLY STRL LF (GAUZE/BANDAGES/DRESSINGS) ×2 IMPLANT
GLOVE BIO SURGEON STRL SZ8 (GLOVE) ×4 IMPLANT
GOWN STRL REUS W/ TWL LRG LVL3 (GOWN DISPOSABLE) ×2 IMPLANT
GOWN STRL REUS W/ TWL XL LVL3 (GOWN DISPOSABLE) ×2 IMPLANT
GOWN STRL REUS W/TWL LRG LVL3 (GOWN DISPOSABLE) ×4
GOWN STRL REUS W/TWL XL LVL3 (GOWN DISPOSABLE) ×4
IV NS 1000ML (IV SOLUTION) ×4
IV NS 1000ML BAXH (IV SOLUTION) IMPLANT
IV NS IRRIG 3000ML ARTHROMATIC (IV SOLUTION) ×2 IMPLANT
KIT RM TURNOVER CYSTO AR (KITS) ×4 IMPLANT
NEEDLE HYPO 22GX1.5 SAFETY (NEEDLE) ×4 IMPLANT
NS IRRIG 500ML POUR BTL (IV SOLUTION) ×2 IMPLANT
PENCIL BUTTON HOLSTER BLD 10FT (ELECTRODE) ×4 IMPLANT
SHEATH ACCESS URETERAL 24CM (SHEATH) ×2 IMPLANT
STENT POLARIS 5FRX24X.038 (STENTS) ×2 IMPLANT
SUPPORT SCROTAL LG STRP (MISCELLANEOUS) ×1 IMPLANT
SUPPORTER ATHLETIC LG (MISCELLANEOUS) ×1
SUT CHROMIC 3 0 PS 2 (SUTURE) ×4 IMPLANT
SUT SILK 2 0 TIES 17X18 (SUTURE) ×4
SUT SILK 2-0 18XBRD TIE BLK (SUTURE) ×2 IMPLANT
TOWEL OR 17X24 6PK STRL BLUE (TOWEL DISPOSABLE) ×8 IMPLANT
TUBE CONNECTING 12'X1/4 (SUCTIONS) ×1
TUBE CONNECTING 12X1/4 (SUCTIONS) ×1 IMPLANT

## 2017-02-20 NOTE — Op Note (Signed)
PATIENT:  Micheal Malone  PRE-OPERATIVE DIAGNOSIS: 1.  left Ureteral calculus 2. Desires sterility  POST-OPERATIVE DIAGNOSIS: Same  PROCEDURE:  1. Cystoscopy with left retrograde pyelogram including interpretation 2. Left ureteroscopy, laser lithotripsy and stent placement 3. Bilateral vasectomy  SURGEON: Garnett Farm, MD  INDICATION: Micheal Malone is a 36 year old male with a left distal ureteral stone that has failed to progress. We discussed management options and he has elected to proceed with ureteroscopic treatment of his stone. In addition he desires vasectomy and I will therefore performed that as well as.  ANESTHESIA:  General  EBL:  Minimal  DRAINS: 5 French, 24 cm Polaris stent in the left ureter (with string)  SPECIMEN:  Stone given the patient  DESCRIPTION OF PROCEDURE: The patient was taken to the major OR and placed on the table. General anesthesia was administered and then the patient was moved to the dorsal lithotomy position. The genitalia was sterilely prepped and draped. An official timeout was performed.  Initially the 23 French cystoscope with 30 lens was passed under direct vision into the bladder. The bladder was then fully inspected. It was noted be free of any tumors, stones or inflammatory lesions. Ureteral orifices were of normal configuration and position. A 6 French open-ended ureteral catheter was then passed through the cystoscope into the ureteral orifice in order to perform a left retrograde pyelogram.  A retrograde pyelogram was performed by injecting full-strength contrast up the left ureter under direct fluoroscopic control. It revealed a filling defect in the distal ureter consistent with the stone seen on the preoperative imaging studies. The remainder of the ureter was noted to be normal as was the intrarenal collecting system. I then passed a 0.038 inch floppy-tipped guidewire through the open ended catheter and into the area of the renal pelvis  and this was left in place. The inner portion of a ureteral access sheath was then passed over the guidewire to gently dilate the intramural ureter. I then proceeded with ureteroscopy.  A 6 French rigid ureteroscope was then passed under direct into the bladder and into the left orifice and up the ureter. The stone was identified and I felt it was too large to extract and therefore elected to proceed with laser lithotripsy. The 200  holmium laser fiber was used to fragment the stone. I then used the nitinol basket to extract all of the stone fragments and reinspection of the ureter ureteroscopically revealed no further stone fragments and no injury to the ureter. I then backloaded the cystoscope over the guidewire and passed the stent over the guidewire into the area of the renal pelvis. As the guidewire was removed good curl was noted in the renal pelvis. The bladder was drained and the cystoscope was then removed.   The patient has reviewed information regarding the risks and complications and understands the advantages and disadvantages of elective sterilization. With full informed written and oral consent, he has elected to proceed with vasectomy.  The right vas was then grasped through the skin using the non-piercing vas clamp, the skin was pierced with a single blade of the sharpened hemostat. The sharpened hemostat was then placed through this incision, and the perivasal tissue cleared from around the vas itself. The vas was then delivered through the wound and clamped with a vas clamp, a segment of approximately 1 cm of vasal tissue was cleared, and a silk suture was then passed beneath the vas. The silk suture was then tied proximally, and a second one  was tied distally, isolating the approximately 1 cm segment of vas. I then excised the segment of vas and cauterized the proximal and distal lumens with the eye cautery. The excess suture was then cut, and the vas was allowed to drop back into the  scrotum after observation for any bleeding. The contralateral vas was then treated in an identical fashion. Any further bleeding points were cauterized. The wound was then observed for any bleeding, and none was noted. The patient tolerated the procedure well and without complications.  A sterile gauze dressing was applied as well as a form of scrotal supporting clothing. He was sent home with instructions regarding wound care and signs of infection, and recommended to restrict activity for 48 hours. Approximately 10 gauze pads were given to him to use over the next few days. He was instructed to use ice for the next 12 hours. He was also instructed to call immediately for excessive bleeding, swelling, drainage, discomfort, fever or chills. He was informed he should not consider himself sterile until 2 subsequent semen analyses have been noted to be completely free of sperm.  The patient tolerated the procedure well no intraoperative complications.  PLAN OF CARE: Discharge to home after PACU  PATIENT DISPOSITION:  PACU - hemodynamically stable.

## 2017-02-20 NOTE — Anesthesia Postprocedure Evaluation (Signed)
Anesthesia Post Note  Patient: Micheal Malone  Procedure(s) Performed: Procedure(s) (LRB): CYSTOSCOPY LEFT URETEROSCOPY LEFT RETROGRADE PYELOGRAM /HOLMIUM LASER LITHO / STONE BASKETRY /STENT PLACEMENT (Left) VASECTOMY BILATERAL (Bilateral)  Patient location during evaluation: PACU Anesthesia Type: General Level of consciousness: awake and alert Pain management: pain level controlled Vital Signs Assessment: post-procedure vital signs reviewed and stable Respiratory status: spontaneous breathing, nonlabored ventilation, respiratory function stable and patient connected to nasal cannula oxygen Cardiovascular status: blood pressure returned to baseline and stable Postop Assessment: no signs of nausea or vomiting Anesthetic complications: no       Last Vitals:  Vitals:   02/20/17 1030 02/20/17 1100  BP: 126/81 131/80  Pulse: 62 (!) 53  Resp: 13 16  Temp:  36.8 C    Last Pain:  Vitals:   02/20/17 0700  TempSrc: Oral                 Cecile Hearing

## 2017-02-20 NOTE — Anesthesia Preprocedure Evaluation (Signed)
Anesthesia Evaluation  Patient identified by MRN, date of birth, ID band Patient awake    Reviewed: Allergy & Precautions, NPO status , Patient's Chart, lab work & pertinent test results  Airway Mallampati: II  TM Distance: >3 FB Neck ROM: Full    Dental  (+) Teeth Intact, Dental Advisory Given   Pulmonary neg pulmonary ROS,    Pulmonary exam normal breath sounds clear to auscultation       Cardiovascular negative cardio ROS Normal cardiovascular exam Rhythm:Regular Rate:Normal     Neuro/Psych negative neurological ROS  negative psych ROS   GI/Hepatic negative GI ROS, Neg liver ROS,   Endo/Other  negative endocrine ROS  Renal/GU Nephrolithiasis     Musculoskeletal negative musculoskeletal ROS (+)   Abdominal   Peds  Hematology negative hematology ROS (+)   Anesthesia Other Findings Day of surgery medications reviewed with the patient.  Reproductive/Obstetrics                             Anesthesia Physical Anesthesia Plan  ASA: I  Anesthesia Plan: General   Post-op Pain Management:    Induction: Intravenous  Airway Management Planned: LMA  Additional Equipment:   Intra-op Plan:   Post-operative Plan: Extubation in OR  Informed Consent: I have reviewed the patients History and Physical, chart, labs and discussed the procedure including the risks, benefits and alternatives for the proposed anesthesia with the patient or authorized representative who has indicated his/her understanding and acceptance.   Dental advisory given  Plan Discussed with: CRNA  Anesthesia Plan Comments: (Risks/benefits of general anesthesia discussed with patient including risk of damage to teeth, lips, gum, and tongue, nausea/vomiting, allergic reactions to medications, and the possibility of heart attack, stroke and death.  All patient questions answered.  Patient wishes to proceed.)         Anesthesia Quick Evaluation

## 2017-02-20 NOTE — Transfer of Care (Signed)
Immediate Anesthesia Transfer of Care Note  Patient: Micheal Malone  Procedure(s) Performed: Procedure(s): CYSTOSCOPY LEFT URETEROSCOPY LEFT RETROGRADE PYELOGRAM /HOLMIUM LASER LITHO / STONE BASKETRY /STENT PLACEMENT (Left) VASECTOMY BILATERAL (Bilateral)  Patient Location: PACU  Anesthesia Type:General  Level of Consciousness: awake, alert  and oriented  Airway & Oxygen Therapy: Patient Spontanous Breathing and Patient connected to nasal cannula oxygen  Post-op Assessment: Report given to RN  Post vital signs: Reviewed and stable  Last Vitals: 135/93, 68, 14, 100%, 98.2 Vitals:   02/20/17 0700  BP: 127/87  Pulse: 72  Resp: 18  Temp: 37 C    Last Pain:  Vitals:   02/20/17 0700  TempSrc: Oral      Patients Stated Pain Goal: 9 (02/20/17 0738)  Complications: No apparent anesthesia complications

## 2017-02-20 NOTE — Discharge Instructions (Signed)
Post stone removal/stent placement surgery instructions   Definitions:  Ureter: The duct that transports urine from the kidney to the bladder. Stent: A plastic hollow tube that is placed into the ureter, from the kidney to the bladder to prevent the ureter from swelling shut.  General instructions:  Despite the fact that no skin incisions were used, the area around the ureter and bladder is raw and irritated. The stent is a foreign body which will further irritate the bladder wall. This irritation is manifested by increased frequency of urination, both day and night, and by an increase in the urge to urinate. In some, the urge to urinate is present almost always. Sometimes the urge is strong enough that you may not be able to stop your self from urinating. The only real cure is to remove the stent and then give time for the bladder wall to heal which can't be done until the danger of the ureter swelling shut has passed. (This varies from 2-21 days).  You may see some blood in your urine while the stent is in place and a few days afterward. Do not be alarmed, even if the urine is clear for a while. Get off your feet and drink lots of fluids until clearing occurs. If you start to pass clots or don't improve, call us.  If you have a string coming from your urethra:  The stent string is attached to your ureteral stent.  Do not pull on thisIf you have a string coming from your urethra:  The stent string is attached to your ureteral stent.  Do not pull on this.  Diet:  You may return to your normal diet immediately. Because of the raw surface of your bladder, alcohol, spicy foods, foods high in acid and drinks with caffeine may cause irritation or frequency and should be used in moderation. To keep your urine flowing freely and avoid constipation, drink plenty of fluids during the day (8-10 glasses). Tip: Avoid cranberry juice because it is very acidic.  Activity:  Your physical activity doesn't need  to be restricted. However, if you are very active, you may see some blood in the urine. We suggest that you reduce your activity under the circumstances until the bleeding has stopped.  Bowels:  It is important to keep your bowels regular during the postoperative period. Straining with bowel movements can cause bleeding. A bowel movement every other day is reasonable. Use a mild laxative if needed, such as milk of magnesia 2-3 tablespoons, or 2 Dulcolax tablets. Call if you continue to have problems. If you had been taking narcotics for pain, before, during or after your surgery, you may be constipated. Take a laxative if necessary.     Medication:  You should resume your pre-surgery medications unless told not to. DO NOT RESUME YOUR ASPIRIN, or any other medicines like ibuprofen, motrin, excedrin, advil, aleve, vitamin E, fish oil as these can all cause bleeding x 7 days. In addition you may be given an antibiotic to prevent or treat infection. Antibiotics are not always necessary. All medication should be taken as prescribed until the bottles are finished unless you are having an unusual reaction to one of the drugs.  Problems you should report to [place]:  a. Fever greater than 101F. b. Heavy bleeding, or clots (see notes above about blood in urine). c. Inability to urinate. d. Drug reactions (hives, rash, nausea, vomiting, diarrhea). e. Severe burning or pain with urination that is not improving.  Followup:  You will need a followup appointment to monitor your progress in most cases. Please call the office for this appointment when you get home if your appointment has not already been scheduled. Usually the first appointment will be about 5-14 days after your surgery and if you have a stent in place it will likely be removed at that time.  Vasectomy, Care After Refer to this sheet in the next few weeks. These instructions provide you with information on caring for yourself after your  procedure. Your health care provider may also give you more specific instructions. Your treatment has been planned according to current medical practices, but problems sometimes occur. Call your health care provider if you have any problems or questions after your procedure. What can I expect after the procedure? After your procedure, it is typical to have the following:  Slight swelling or redness or both at the surgical site.  Mild pain or discomfort in the scrotum.  Some oozing of blood from the cuts (incisions) made by the surgeon is normal during the first day or two after the procedure.  Blood in the ejaculate is common and typically clears after a few days. Follow these instructions at home:  Only take over-the-counter or prescription medicines for pain, discomfort, or fever as directed by your health care provider.  Avoid using nonsteroidal anti-inflammatory drugs (NSAIDs) because these can make bleeding worse.  Apply ice to the injured area:  Put ice in a plastic bag.  Place a towel between your skin and the bag.  Leave the ice on for 20 minutes, 2-3 times a day.  Avoid being active for the first 2 days after surgery.  Wear a supporter while moving around for the first week after surgery. You may add some sterile fluffed bandages or a clean washcloth to the scrotal support if the scrotal support irritates your skin.  Do not participate in sports or perform heavy physical labor for at least 2 weeks.  You may have protected intercourse 7-10 days after your procedure. Remember, you are not sterile until follow-up specimens show no sperm in your ejaculate.  Be sure to follow up with your surgeon as instructed to confirm sterility. It usually requires multiple ejaculations to clear the sperm located beyond the vasectomy site of blockage. You will need at least two specimens showing an absence of sperm before you can resume unprotected intercourse. Contact a health care provider  if:  You have redness, swelling, or increasing pain in the wounds or testicles (scrotum).  You see pus coming from the wound.  You have a fever.  You notice a foul smell coming from the wound or dressing.  You notice a breaking open of the stitches (suture) line or wound edges even after sutures have been removed.  You have increased bleeding from the wounds. Get help right away if:  You develop a rash.  You have difficulty breathing.  You have any reaction or side effects to medicines given.   Post Anesthesia Home Care Instructions  Activity: Get plenty of rest for the remainder of the day. A responsible individual must stay with you for 24 hours following the procedure.  For the next 24 hours, DO NOT: -Drive a car -Advertising copywriter -Drink alcoholic beverages -Take any medication unless instructed by your physician -Make any legal decisions or sign important papers.  Meals: Start with liquid foods such as gelatin or soup. Progress to regular foods as tolerated. Avoid greasy, spicy, heavy foods. If nausea and/or vomiting occur,  drink only clear liquids until the nausea and/or vomiting subsides. Call your physician if vomiting continues.  Special Instructions/Symptoms: Your throat may feel dry or sore from the anesthesia or the breathing tube placed in your throat during surgery. If this causes discomfort, gargle with warm salt water. The discomfort should disappear within 24 hours.  If you had a scopolamine patch placed behind your ear for the management of post- operative nausea and/or vomiting:  1. The medication in the patch is effective for 72 hours, after which it should be removed.  Wrap patch in a tissue and discard in the trash. Wash hands thoroughly with soap and water. 2. You may remove the patch earlier than 72 hours if you experience unpleasant side effects which may include dry mouth, dizziness or visual disturbances. 3. Avoid touching the patch. Wash your  hands with soap and water after contact with the patch.

## 2017-02-20 NOTE — Anesthesia Procedure Notes (Addendum)
Procedure Name: LMA Insertion Date/Time: 02/20/2017 7:28 AM Performed by: Maris Berger T Pre-anesthesia Checklist: Patient identified, Emergency Drugs available, Suction available and Patient being monitored Patient Re-evaluated:Patient Re-evaluated prior to inductionOxygen Delivery Method: Circle system utilized Preoxygenation: Pre-oxygenation with 100% oxygen Intubation Type: IV induction Ventilation: Mask ventilation without difficulty LMA: LMA flexible inserted LMA Size: 5.0 Number of attempts: 1 Airway Equipment and Method: Bite block Placement Confirmation: positive ETCO2 Tube secured with: Tape Dental Injury: Teeth and Oropharynx as per pre-operative assessment

## 2017-02-21 ENCOUNTER — Encounter (HOSPITAL_BASED_OUTPATIENT_CLINIC_OR_DEPARTMENT_OTHER): Payer: Self-pay | Admitting: Urology

## 2017-08-29 ENCOUNTER — Emergency Department (HOSPITAL_BASED_OUTPATIENT_CLINIC_OR_DEPARTMENT_OTHER): Payer: BLUE CROSS/BLUE SHIELD

## 2017-08-29 ENCOUNTER — Emergency Department (HOSPITAL_BASED_OUTPATIENT_CLINIC_OR_DEPARTMENT_OTHER)
Admission: EM | Admit: 2017-08-29 | Discharge: 2017-08-30 | Disposition: A | Discharge status: Home / Self Care | Payer: BLUE CROSS/BLUE SHIELD | Attending: Emergency Medicine | Admitting: Emergency Medicine

## 2017-08-29 ENCOUNTER — Encounter (HOSPITAL_BASED_OUTPATIENT_CLINIC_OR_DEPARTMENT_OTHER): Payer: Self-pay | Admitting: *Deleted

## 2017-08-29 DIAGNOSIS — Z87891 Personal history of nicotine dependence: Secondary | ICD-10-CM | POA: Diagnosis not present

## 2017-08-29 DIAGNOSIS — M79675 Pain in left toe(s): Secondary | ICD-10-CM | POA: Insufficient documentation

## 2017-08-29 NOTE — ED Triage Notes (Signed)
Pain in his left great toe for a week. No known injury.

## 2017-08-30 MED ORDER — IBUPROFEN 800 MG PO TABS: 800.0000 mg | ORAL_TABLET | Freq: Three times a day (TID) | ORAL | 0 refills | Status: DC

## 2017-08-30 MED ORDER — HYDROCODONE-ACETAMINOPHEN 5-325 MG PO TABS: 1.0000 | ORAL_TABLET | Freq: Four times a day (QID) | ORAL | 0 refills | Status: DC | PRN

## 2017-08-30 MED ORDER — IBUPROFEN 800 MG PO TABS
800.0000 mg | ORAL_TABLET | Freq: Once | ORAL | Status: AC
  Administered 2017-08-30: 800 mg via ORAL
  Filled 2017-08-30: qty 1

## 2017-08-30 NOTE — Discharge Instructions (Signed)
Your x-ray today was normal. There is no sign of gout, blood clot in your leg called a DVT, arterial obstruction, infection. This could be inflammation of a tendon called tendinitis. I recommend anti-inflammatories, ibuprofen 800 mg every 8 hours for the next week. Please take this medication with food. If symptoms are not improving, I recommend follow-up with a primary care provider or orthopedic physician.

## 2017-08-30 NOTE — ED Provider Notes (Signed)
TIME SEEN: 12:03 AM  CHIEF COMPLAINT: left great toe pain  HPI: Patient 36 year old male with history of kidney stones who presents to the emergency department with a week of left great toe pain. Pain worse with flexion and extension of the toe. He does not remember any injury. States that he is unable to ambulate barefoot because of pain. He denies wearing any new shoes. 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 Reports he wears comfortable tennis shoes regularly. No redness or warmth. No swelling noted. No history of gout. No fever. No calf swelling or tenderness. No history of DVT. No numbness tingling. His foot is warm and well-perfused. Foot is not cold or blue. He has never had similar symptoms.  ROS: See HPI Constitutional: no fever  Eyes: no drainage  ENT: no runny nose   Cardiovascular:  no chest pain  Resp: no SOB  GI: no vomiting GU: no dysuria Integumentary: no rash  Allergy: no hives  Musculoskeletal: no leg swelling  Neurological: no slurred speech ROS otherwise negative  PAST MEDICAL HISTORY/PAST SURGICAL HISTORY:  Past Medical History:  Diagnosis Date  . History of kidney stones   . Left ureteral stone   . Wears glasses     MEDICATIONS:  Prior to Admission medications   Medication Sig Start Date End Date Taking? Authorizing Provider  HYDROcodone-acetaminophen (NORCO) 10-325 MG tablet Take 1-2 tablets by mouth every 4 (four) hours as needed for moderate pain. Maximum dose per 24 hours - 8 pills 02/20/17   Ihor Gully, MD  phenazopyridine (PYRIDIUM) 200 MG tablet Take 1 tablet (200 mg total) by mouth 3 (three) times daily. Patient taking differently: Take 200 mg by mouth 3 (three) times daily as needed.  01/24/17   Rise Mu, PA-C    ALLERGIES:  Allergies  Allergen Reactions  . Keflex [Cephalexin] Hives    SOCIAL HISTORY:  Social History  Substance Use Topics  . Smoking status: Never Smoker  . Smokeless tobacco: Former  Neurosurgeon    Types: Snuff    Quit date: 02/16/2016  . Alcohol use Yes     Comment: occassionally    FAMILY HISTORY: Family History  Problem Relation Age of Onset  . Cerebral aneurysm Father   . Heart failure Father     EXAM: BP (!) 127/92   Pulse 88   Temp 97.7 F (36.5 C) (Oral)   Resp 16   Ht 5\' 11"  (1.803 m)   Wt 80.3 kg (177 lb)   SpO2 100%   BMI 24.69 kg/m  CONSTITUTIONAL: Alert and oriented and responds appropriately to questions. Well-appearing; well-nourished HEAD: Normocephalic EYES: Conjunctivae clear, pupils appear equal, EOMI ENT: normal nose; moist mucous membranes NECK: Supple, no meningismus, no nuchal rigidity, no LAD  CARD: RRR; S1 and S2 appreciated; no murmurs, no clicks, no rubs, no gallops RESP: Normal chest excursion without splinting or tachypnea; breath sounds clear and equal bilaterally; no wheezes, no rhonchi, no rales, no hypoxia or respiratory distress, speaking full sentences ABD/GI: Normal bowel sounds; non-distended; soft, non-tender, no rebound, no guarding, no peritoneal signs, no hepatosplenomegaly BACK:  The back appears normal and is non-tender to palpation, there is no CVA tenderness EXT: patient is tender to palpation over the DIP and MTP of the left great toe and has pain with flexion of these joints. Able to fully extend. There is no erythema, warmth or swelling. I did not appreciate any joint effusions.He has 2+ strong palpable  DP pulse in the left foot. No edema or ecchymosis present. No tenderness over the left ankle, left knee.  No bony deformity.  Otherwise Normal ROM in all joints; otherwise extremities are non-tender to palpation; no edema; normal capillary refill; no cyanosis, no calf tenderness or swelling    SKIN: Normal color for age and race; warm; no rash NEURO: Moves all extremities equally, normal sensation diffusely throughout the left leg PSYCH: The patient's mood and manner are appropriate. Grooming and personal hygiene are  appropriate.  MEDICAL DECISION MAKING: Pt here with left great toe pain. Does not recall any injury. X-ray obtained in triage is unremarkable. There is no sign of gout, septic arthritis on exam. Doubt DVT or arterial obstruction. He is neurovascularly intact distally. Discussed with him that this could be tendinitis, tenosynovitis, sprain. Recommended rest, elevation, ice and anti-inflammatories for the next week. I do not feel he needs further emergent workup. I feel he can be followed as an outpatient if symptoms do not improve with medical management. Given outpatient PCP as well as orthopedic follow-up. Discussed return precautions. He is comfortable with this plan.  At this time, I do not feel there is any life-threatening condition present. I have reviewed and discussed all results (EKG, imaging, lab, urine as appropriate) and exam findings with patient/family. I have reviewed nursing notes and appropriate previous records.  I feel the patient is safe to be discharged home without further emergent workup and can continue workup as an outpatient as needed. Discussed usual and customary return precautions. Patient/family verbalize understanding and are comfortable with this plan.  Outpatient follow-up has been provided if needed. All questions have been answered.      Ward, Layla MawKristen N, DO 08/30/17 [phone]

## 2017-11-26 ENCOUNTER — Emergency Department (HOSPITAL_BASED_OUTPATIENT_CLINIC_OR_DEPARTMENT_OTHER)
Admission: EM | Admit: 2017-11-26 | Discharge: 2017-11-26 | Disposition: A | Discharge status: Home / Self Care | Payer: BLUE CROSS/BLUE SHIELD | Attending: Emergency Medicine | Admitting: Emergency Medicine

## 2017-11-26 ENCOUNTER — Other Ambulatory Visit: Payer: Self-pay

## 2017-11-26 ENCOUNTER — Encounter (HOSPITAL_BASED_OUTPATIENT_CLINIC_OR_DEPARTMENT_OTHER): Payer: Self-pay

## 2017-11-26 ENCOUNTER — Emergency Department (HOSPITAL_BASED_OUTPATIENT_CLINIC_OR_DEPARTMENT_OTHER): Payer: BLUE CROSS/BLUE SHIELD

## 2017-11-26 DIAGNOSIS — Z87891 Personal history of nicotine dependence: Secondary | ICD-10-CM | POA: Insufficient documentation

## 2017-11-26 DIAGNOSIS — R0602 Shortness of breath: Secondary | ICD-10-CM | POA: Diagnosis not present

## 2017-11-26 DIAGNOSIS — R0789 Other chest pain: Secondary | ICD-10-CM

## 2017-11-26 DIAGNOSIS — R0981 Nasal congestion: Secondary | ICD-10-CM | POA: Diagnosis not present

## 2017-11-26 LAB — BASIC METABOLIC PANEL
ANION GAP: 10 (ref 5–15)
BUN: 12 mg/dL (ref 6–20)
CALCIUM: 9.3 mg/dL (ref 8.9–10.3)
CO2: 26 mmol/L (ref 22–32)
CREATININE: 0.97 mg/dL (ref 0.61–1.24)
Chloride: 103 mmol/L (ref 101–111)
GFR calc Af Amer: >60 mL/min (ref >60)
GLUCOSE: 98 mg/dL (ref 65–99)
Potassium: 3.5 mmol/L (ref 3.5–5.1)
Sodium: 139 mmol/L (ref 135–145)

## 2017-11-26 LAB — CBC
HCT: 44.1 % (ref 39.0–52.0)
HEMOGLOBIN: 15.8 g/dL (ref 13.0–17.0)
MCH: 29.9 pg (ref 26.0–34.0)
MCHC: 35.8 g/dL (ref 30.0–36.0)
MCV: 83.4 fL (ref 78.0–100.0)
PLATELETS: 286 10*3/uL (ref 150–400)
RBC: 5.29 MIL/uL (ref 4.22–5.81)
RDW: 12.5 % (ref 11.5–15.5)
WBC: 7.4 10*3/uL (ref 4.0–10.5)

## 2017-11-26 LAB — TROPONIN I
Troponin I: <0.03 ng/mL (ref <0.03)
Troponin I: <0.03 ng/mL (ref <0.03)

## 2017-11-26 LAB — D-DIMER, QUANTITATIVE (NOT AT ARMC)

## 2017-11-26 MED ORDER — GI COCKTAIL ~~LOC~~
30.0000 mL | Freq: Once | ORAL | Status: AC
  Administered 2017-11-26: 30 mL via ORAL
  Filled 2017-11-26: qty 30

## 2017-11-26 MED ORDER — KETOROLAC TROMETHAMINE 30 MG/ML IJ SOLN
30.0000 mg | Freq: Once | INTRAMUSCULAR | Status: DC
  Filled 2017-11-26: qty 1

## 2017-11-26 MED ORDER — KETOROLAC TROMETHAMINE 30 MG/ML IJ SOLN
30.0000 mg | Freq: Once | INTRAMUSCULAR | Status: AC
  Administered 2017-11-26: 30 mg via INTRAVENOUS

## 2017-11-26 MED ORDER — ALBUTEROL SULFATE HFA 108 (90 BASE) MCG/ACT IN AERS
2.0000 | INHALATION_SPRAY | Freq: Once | RESPIRATORY_TRACT | Status: AC
  Administered 2017-11-26: 2 via RESPIRATORY_TRACT
  Filled 2017-11-26: qty 6.7

## 2017-11-26 NOTE — ED Notes (Signed)
Lying 99% HR 68 Sitting 99% HR 79 Standing 99% HR 78 Walking 98% HR 75 Denies: nausea, increased pain or sob, dizziness or light headedness

## 2017-11-26 NOTE — ED Notes (Signed)
Alert, NAD, calm, no changes, SB 40s-60s on monitor, states, chest pressure, heaviness, can't get a deep satisfying breath remains.

## 2017-11-26 NOTE — Discharge Instructions (Signed)
Continue taking over-the-counter cold medications for your nasal congestion.  You may use albuterol inhaler every 4-6 hours as needed for shortness of breath.  Drink plenty of fluids and get plenty of rest. Alternate 600 mg of ibuprofen and [phone] mg of Tylenol every 3 hours as needed for pain for the next 3-4 days. Do not exceed 4000 mg of Tylenol daily.  Follow-up with a primary care physician for reevaluation of your symptoms.  Return to the emergency department if any concerning signs or symptoms develop such as persisting chest pain, persisting shortness of breath, fevers, or coughing up blood.

## 2017-11-26 NOTE — ED Triage Notes (Signed)
Pt reports centralized chest pain with shortness of breath that began this morning and has worsened. Pt reports recent URI without treatment. No asthma hx.

## 2017-11-26 NOTE — ED Notes (Signed)
Alert, NAD, calm, interactive, resps e/u, speaking in clear complete sentences, no dyspnea noted, skin W&D, VSS, c/o "just hard to catch breath, sob", (denies: pain, nausea, dizziness or visual changes). NSR/SB on monitor, HR 60 (50-70).

## 2017-11-26 NOTE — ED Provider Notes (Signed)
[facility] EMERGENCY DEPARTMENT Provider Note   CSN: [id number] Arrival date & time: 11/26/17  1815     History   Chief Complaint Chief Complaint  Patient presents with  . Chest Pain    HPI Micheal Malone is a 37 y.o. male with history of kidney stones  presents today with chief complaint acute onset, constant shortness of breath and substernal chest pain which began earlier this morning around 7 AM.  Patient states that he has been experiencing URI symptoms for the past week with symptoms of nasal congestion and sinus pressure.  He denies sore throat, fevers, difficulty breathing, or chest pain at that time.  Denies cough.  He states that when he awoke at around 7 AM he was experiencing shortness of breath and feels like he cannot get a full breath.  Also endorses substernal chest pressure which does not radiate.  Symptoms are nonexertional.  He denies lightheadedness, nausea, vomiting, or diaphoresis.  He is a non-smoker.  He denies history of diabetes, hypertension, or hyperlipidemia.  He does state that his father has extensive cardiac disease and he has multiple family members with heart disease.  States his cousin died of a massive MI when he was in his 30s.  He has not tried anything for his symptoms today but has tried NyQuil for his cold over the past week with improvement in his symptoms.  He is a non-smoker, no recreational drug use, and denies excessive caffeine or alcohol use.  Denies recent travel or surgeries, no hemoptysis, no prior history of DVT or PE, and he is not on testosterone placement therapy.  The history is provided by the patient.    Past Medical History:  Diagnosis Date  . History of kidney stones   . Left ureteral stone   . Wears glasses     There are no active problems to display for this patient.   Past Surgical History:  Procedure Laterality Date  . CYSTOSCOPY/URETEROSCOPY/HOLMIUM LASER/STENT PLACEMENT Left 02/20/2017   Procedure:  CYSTOSCOPY LEFT URETEROSCOPY LEFT RETROGRADE PYELOGRAM Manning Charity LASER LITHO / STONE BASKETRY /STENT PLACEMENT;  Surgeon: Ihor Gully, MD;  Location: [facility];  Service: Urology;  Laterality: Left;  . NO PAST SURGERIES    . VASECTOMY Bilateral 02/20/2017   Procedure: VASECTOMY BILATERAL;  Surgeon: Ihor Gully, MD;  Location: [facility];  Service: Urology;  Laterality: Bilateral;       Home Medications    Prior to Admission medications   Medication Sig Start Date End Date Taking? Authorizing Provider  HYDROcodone-acetaminophen (NORCO/VICODIN) 5-325 MG tablet Take 1-2 tablets by mouth every 6 (six) hours as needed. 08/30/17   Ward, Layla Maw, DO  ibuprofen (ADVIL,MOTRIN) 800 MG tablet Take 1 tablet (800 mg total) by mouth 3 (three) times daily. Take with food. 08/30/17   Ward, Layla Maw, DO  phenazopyridine (PYRIDIUM) 200 MG tablet Take 1 tablet (200 mg total) by mouth 3 (three) times daily. Patient taking differently: Take 200 mg by mouth 3 (three) times daily as needed.  01/24/17   Rise Mu, PA-C    Family History Family History  Problem Relation Age of Onset  . Cerebral aneurysm Father   . Heart failure Father     Social History Social History   Tobacco Use  . Smoking status: Never Smoker  . Smokeless tobacco: Former Neurosurgeon    Types: Snuff  Substance Use Topics  . Alcohol use: Yes    Comment: occassionally  . Drug  use: No     Allergies   Keflex [cephalexin]   Review of Systems Review of Systems  Constitutional: Negative for chills and fever.  HENT: Positive for congestion.   Respiratory: Positive for shortness of breath. Negative for cough.   Cardiovascular: Positive for chest pain. Negative for palpitations and leg swelling.  Gastrointestinal: Negative for abdominal pain, diarrhea, nausea and vomiting.     Physical Exam Updated Vital Signs BP 120/85   Pulse (!) 59   Temp 97.6 F (36.4 C) (Oral)   Resp 20   Ht 5'  11" (1.803 m)   Wt 81.6 kg (180 lb)   SpO2 99%   BMI 25.10 kg/m   Physical Exam  Constitutional: He appears well-developed and well-nourished. No distress.  HENT:  Head: Normocephalic and atraumatic.  Eyes: Conjunctivae are normal. Right eye exhibits no discharge. Left eye exhibits no discharge.  Neck: Normal range of motion. Neck supple. No JVD present. No tracheal deviation present.  Cardiovascular: Normal rate and regular rhythm.  Pulses:      Radial pulses are 2+ on the right side, and 2+ on the left side.       Dorsalis pedis pulses are 2+ on the right side, and 2+ on the left side.       Posterior tibial pulses are 2+ on the right side, and 2+ on the left side.  Pulmonary/Chest: Effort normal and breath sounds normal. No accessory muscle usage or stridor. No tachypnea. No respiratory distress.  Equal rise and fall of chest, no increased work of breathing, no chest wall tenderness to palpation  Abdominal: Soft. Bowel sounds are normal. He exhibits no distension. There is no tenderness.  Musculoskeletal: He exhibits no edema.       Right lower leg: Normal. He exhibits no tenderness and no edema.       Left lower leg: Normal. He exhibits no tenderness and no edema.  Neurological: He is alert.  Skin: Skin is warm and dry. No erythema.  Psychiatric: He has a normal mood and affect. His behavior is normal.  Nursing note and vitals reviewed.    ED Treatments / Results  Labs (all labs ordered are listed, but only abnormal results are displayed) Labs Reviewed  BASIC METABOLIC PANEL  CBC  TROPONIN I  D-DIMER, QUANTITATIVE (NOT [facility])  TROPONIN I    EKG  EKG Interpretation  Date/Time:  Sunday November 26 2017 18:20:24 EST Ventricular Rate:  67 PR Interval:  160 QRS Duration: 86 QT Interval:  402 QTC Calculation: 424 R Axis:   122 Text Interpretation:  Normal sinus rhythm Right axis deviation Abnormal ECG No old tracing to compare Confirmed by Rolan BuccoBelfi, Melanie [phone]) on  11/26/2017 7:01:12 PM       Radiology Dg Chest 2 View  Result Date: 11/26/2017 CLINICAL DATA:  Chest heaviness.  Difficulty breathing. EXAM: CHEST  2 VIEW COMPARISON:  January 21, 2014 FINDINGS: The heart size and mediastinal contours are within normal limits. Both lungs are clear. The visualized skeletal structures are unremarkable. IMPRESSION: No active cardiopulmonary disease. Electronically Signed   By: Gerome Samavid  Williams III M.D   On: 11/26/2017 18:58    Procedures Procedures (including critical care time)  Medications Ordered in ED Medications  gi cocktail (Maalox,Lidocaine,Donnatal) (30 mLs Oral Given 11/26/17 2015)  ketorolac (TORADOL) 30 MG/ML injection 30 mg (30 mg Intravenous Given 11/26/17 2015)  albuterol (PROVENTIL HFA;VENTOLIN HFA) 108 (90 Base) MCG/ACT inhaler 2 puff (2 puffs Inhalation Given 11/26/17 2302)  Initial Impression / Assessment and Plan / ED Course  I have reviewed the triage vital signs and the nursing notes.  Pertinent labs & imaging results that were available during my care of the patient were reviewed by me and considered in my medical decision making (see chart for details).     Patient with nasal congestion for 1 week and sudden onset of shortness of breath and substernal chest pressure since he awoke this morning.  Afebrile, intermittently mildly bradycardic while in the ED but otherwise vital signs are stable.  He is not hypoxic and displays no increased work of breathing.  Chest x-ray shows no acute cardiopulmonary abnormality such as pneumonia, pulmonary vascular congestion, or bronchitis breath sounds are clear to auscultation.  Serial troponins are negative, EKG shows normal sinus rhythm with no evidence of arrhythmia or ST segment abnormality.  No evidence of pericarditis or myocarditis.  D-dimer is negative and I have a low suspicion of DVT or PE in a patient with no risk factors.  No evidence of AAA or aortic dissection.  Lab work is otherwise  unremarkable.  Patient was ambulate with stable SPO2 saturations and no increased work of breathing.  He had some improvement in his shortness of breath and chest pressure after 2 puffs of the albuterol inhaler.  He was given GI cocktail but states this did not change his symptoms and I have a low suspicion of acute intra-abdominal processes.  No further emergent workup required at this time.  Patient remains nontoxic in appearance, tolerating p.o. food and fluids in the ED without difficulty.  Patient will follow up with his primary care physician for reevaluation of his symptoms.  Discussed indications for return to the ED. Pt verbalized understanding of and agreement with plan and is safe for discharge home at this time.  He has no complaints prior to discharge.  Final Clinical Impressions(s) / ED Diagnoses   Final diagnoses:  Atypical chest pain  SOB (shortness of breath)  Nasal congestion    ED Discharge Orders    None       Jeanie Sewer, PA-C 11/27/17 1453    Rolan Bucco, MD 11/27/17 1605

## 2018-07-25 ENCOUNTER — Encounter (HOSPITAL_BASED_OUTPATIENT_CLINIC_OR_DEPARTMENT_OTHER): Payer: Self-pay

## 2018-07-25 ENCOUNTER — Emergency Department (HOSPITAL_BASED_OUTPATIENT_CLINIC_OR_DEPARTMENT_OTHER)
Admission: EM | Admit: 2018-07-25 | Discharge: 2018-07-25 | Disposition: A | Discharge status: Home / Self Care | Payer: BLUE CROSS/BLUE SHIELD | Attending: Emergency Medicine | Admitting: Emergency Medicine

## 2018-07-25 ENCOUNTER — Emergency Department (HOSPITAL_BASED_OUTPATIENT_CLINIC_OR_DEPARTMENT_OTHER): Payer: BLUE CROSS/BLUE SHIELD

## 2018-07-25 ENCOUNTER — Other Ambulatory Visit: Payer: Self-pay

## 2018-07-25 DIAGNOSIS — F17210 Nicotine dependence, cigarettes, uncomplicated: Secondary | ICD-10-CM | POA: Insufficient documentation

## 2018-07-25 DIAGNOSIS — J069 Acute upper respiratory infection, unspecified: Secondary | ICD-10-CM | POA: Insufficient documentation

## 2018-07-25 DIAGNOSIS — R05 Cough: Secondary | ICD-10-CM | POA: Diagnosis present

## 2018-07-25 DIAGNOSIS — B9789 Other viral agents as the cause of diseases classified elsewhere: Secondary | ICD-10-CM

## 2018-07-25 LAB — CBC WITH DIFFERENTIAL/PLATELET
BASOS ABS: 0 10*3/uL (ref 0.0–0.1)
Basophils Relative: 0 %
EOS PCT: 1 %
Eosinophils Absolute: 0.1 10*3/uL (ref 0.0–0.7)
HCT: 42 % (ref 39.0–52.0)
HEMOGLOBIN: 15.2 g/dL (ref 13.0–17.0)
LYMPHS PCT: 27 %
Lymphs Abs: 2.7 10*3/uL (ref 0.7–4.0)
MCH: 30.6 pg (ref 26.0–34.0)
MCHC: 36.2 g/dL — ABNORMAL HIGH (ref 30.0–36.0)
MCV: 84.5 fL (ref 78.0–100.0)
Monocytes Absolute: 0.8 10*3/uL (ref 0.1–1.0)
Monocytes Relative: 8 %
NEUTROS PCT: 64 %
Neutro Abs: 6.4 10*3/uL (ref 1.7–7.7)
PLATELETS: 288 10*3/uL (ref 150–400)
RBC: 4.97 MIL/uL (ref 4.22–5.81)
RDW: 12.7 % (ref 11.5–15.5)
WBC: 10 10*3/uL (ref 4.0–10.5)

## 2018-07-25 LAB — BASIC METABOLIC PANEL
ANION GAP: 10 (ref 5–15)
BUN: 10 mg/dL (ref 6–20)
CO2: 26 mmol/L (ref 22–32)
Calcium: 9.3 mg/dL (ref 8.9–10.3)
Chloride: 102 mmol/L (ref 98–111)
Creatinine, Ser: 0.8 mg/dL (ref 0.61–1.24)
Glucose, Bld: 90 mg/dL (ref 70–99)
POTASSIUM: 4 mmol/L (ref 3.5–5.1)
SODIUM: 138 mmol/L (ref 135–145)

## 2018-07-25 LAB — TROPONIN I: Troponin I: <0.03 ng/mL (ref <0.03)

## 2018-07-25 LAB — D-DIMER, QUANTITATIVE (NOT AT ARMC): D DIMER QUANT: 0.3 ug{FEU}/mL (ref 0.00–0.50)

## 2018-07-25 MED ORDER — BENZONATATE 100 MG PO CAPS: 100.0000 mg | ORAL_CAPSULE | Freq: Three times a day (TID) | ORAL | 0 refills | Status: DC

## 2018-07-25 MED ORDER — ALBUTEROL SULFATE HFA 108 (90 BASE) MCG/ACT IN AERS
1.0000 | INHALATION_SPRAY | Freq: Once | RESPIRATORY_TRACT | Status: AC
  Administered 2018-07-25: 1 via RESPIRATORY_TRACT
  Filled 2018-07-25: qty 6.7

## 2018-07-25 NOTE — ED Notes (Signed)
Pt ambulated w/o difficulty; O2 sat 93-95% on RA

## 2018-07-25 NOTE — Discharge Instructions (Signed)
You can take Tylenol or Ibuprofen as directed for pain. You can alternate Tylenol and Ibuprofen every 4 hours. If you take Tylenol at 1pm, then you can take Ibuprofen at 5pm. Then you can take Tylenol again at 9pm.   Use albuterol inhaler as directed.  Take Tessalon Perles as directed.  As we discussed, closely monitor your symptoms.  Return the emergency department for any worsening difficulty breathing, chest pain, vomiting, persistent fever or any other worsening concerning symptoms.

## 2018-07-25 NOTE — ED Triage Notes (Signed)
C/o flu like sx x 4 days-NAD-steady gait 

## 2018-07-25 NOTE — ED Notes (Signed)
Pt/family verbalized understanding of discharge instructions.   

## 2018-07-25 NOTE — ED Provider Notes (Signed)
[facility] EMERGENCY DEPARTMENT Provider Note   CSN: [id number] Arrival date & time: 07/25/18  1532     History   Chief Complaint Chief Complaint  Patient presents with  . Cough    HPI Micheal Malone is a 37 y.o. male with PMH/o Kidney stones who presents for evaluation of 3 days of URI symptoms including rhinorrhea, sore throat, nasal congestion and productive cough. Patient reports he used OTC nyquil and cold medicine with minimal improvement. He states that cough is productive of yellow/green sputum. Today, she started having some chest soreness that he states was worse when he took a deep breath in.  He states that the pain has been constant since this morning but states it had eased up in severity.  He states that while at work, he noticed he was slightly short of breath.  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  He states the last time he felt like this, he was diagnosed with pneumonia several years ago.  No chest pain at rest.  No shortness of breath here in the ED.  Patient does report he vapes several times a day.  Does not smoke any cigarettes. He denies any exogenous hormone use, recent immobilization, prior history of DVT/PE, recent surgery, leg swelling, or long travel.  Patient denies any nausea/vomiting, diarrhea.   The history is provided by the patient.    Past Medical History:  Diagnosis Date  . History of kidney stones   . Left ureteral stone   . Wears glasses     There are no active problems to display for this patient.   Past Surgical History:  Procedure Laterality Date  . CYSTOSCOPY/URETEROSCOPY/HOLMIUM LASER/STENT PLACEMENT Left 02/20/2017   Procedure: CYSTOSCOPY LEFT URETEROSCOPY LEFT RETROGRADE PYELOGRAM Manning Charity LASER LITHO / STONE BASKETRY /STENT PLACEMENT;  Surgeon: Ihor Gully, MD;  [facility];  Service: Urology;  Laterality: Left;  . NO PAST SURGERIES    . VASECTOMY Bilateral 02/20/2017   Procedure: VASECTOMY BILATERAL;  Surgeon: Ihor Gully, MD;  Location: [facility];  Service: Urology;  Laterality: Bilateral;        Home Medications    Prior to Admission medications   Medication Sig Start Date End Date Taking? Authorizing Provider  benzonatate (TESSALON) 100 MG capsule Take 1 capsule (100 mg total) by mouth every 8 (eight) hours. 07/25/18   Maxwell Caul, PA-C  HYDROcodone-acetaminophen (NORCO/VICODIN) 5-325 MG tablet Take 1-2 tablets by mouth every 6 (six) hours as needed. 08/30/17   Ward, Layla Maw, DO  ibuprofen (ADVIL,MOTRIN) 800 MG tablet Take 1 tablet (800 mg total) by mouth 3 (three) times daily. Take with food. 08/30/17   Ward, Layla Maw, DO  phenazopyridine (PYRIDIUM) 200 MG tablet Take 1 tablet (200 mg total) by mouth 3 (three) times daily. Patient taking differently: Take 200 mg by mouth 3 (three) times daily as needed.  01/24/17   Rise Mu, PA-C    Family History Family History  Problem Relation Age of Onset  . Cerebral aneurysm Father   . Heart failure Father     Social History Social History   Tobacco Use  . Smoking status: Current Every Day Smoker    Types: E-cigarettes  . Smokeless tobacco: Former Neurosurgeon    Types: Snuff    Quit date: 02/16/2016  Substance Use  Topics  . Alcohol use: Yes    Comment: occassionally  . Drug use: No     Allergies   Keflex [cephalexin]   Review of Systems Review of Systems  Constitutional: Negative for fever.  HENT: Positive for congestion, rhinorrhea and sore throat.   Respiratory: Positive for cough and chest tightness (soreness). Negative for shortness of breath.   Cardiovascular: Negative for chest pain and leg swelling.  Gastrointestinal: Negative for abdominal pain, nausea and vomiting.  All other systems reviewed and are negative.    Physical Exam Updated  Vital Signs BP 127/78   Pulse 69   Temp 97.9 F (36.6 C) (Oral)   Resp 19   Ht 5\' 11"  (1.803 m)   Wt 86.6 kg   SpO2 100%   BMI 26.64 kg/m   Physical Exam  Constitutional: He is oriented to person, place, and time. He appears well-developed and well-nourished.  Sitting comfortably on examination table  HENT:  Head: Normocephalic and atraumatic.  Mouth/Throat: Uvula is midline, oropharynx is clear and moist and mucous membranes are normal. No trismus in the jaw.  Airway is Patent, phonation intact.  Uvula is midline.  No trismus. Posterior oropharynx is clear.   Eyes: Pupils are equal, round, and reactive to light. Conjunctivae, EOM and lids are normal.  Neck: Full passive range of motion without pain.  Cardiovascular: Normal rate, regular rhythm, normal heart sounds and normal pulses. Exam reveals no gallop and no friction rub.  No murmur heard. Pulmonary/Chest: Effort normal and breath sounds normal. He has no decreased breath sounds.  No evidence of respiratory distress.  Able speak in full sentences without any difficulty. Lungs clear to auscultation bilaterally.  Symmetric chest rise.  No wheezing, rales, rhonchi.  Abdominal: Soft. Normal appearance. There is no tenderness. There is no rigidity and no guarding.  Musculoskeletal: Normal range of motion.  BLE are symmetric in appearance   Neurological: He is alert and oriented to person, place, and time.  Skin: Skin is warm and dry. Capillary refill takes less than 2 seconds.  Psychiatric: He has a normal mood and affect. His speech is normal.  Nursing note and vitals reviewed.    ED Treatments / Results  Labs (all labs ordered are listed, but only abnormal results are displayed) Labs Reviewed  CBC WITH DIFFERENTIAL/PLATELET - Abnormal; Notable for the following components:      Result Value   MCHC 36.2 (*)    All other components within normal limits  BASIC METABOLIC PANEL  D-DIMER, QUANTITATIVE (NOT AT [facility])  TROPONIN  I    EKG EKG Interpretation  Date/Time:  Wednesday July 25 2018 16:49:34 EDT Ventricular Rate:  74 PR Interval:    QRS Duration: 97 QT Interval:  387 QTC Calculation: 430 R Axis:   -17 Text Interpretation:  Normal sinus rhythm Borderline left axis deviation no significant change compared to Jan 2019 Confirmed by Pricilla Loveless [phone]) on 07/25/2018 4:54:27 PM   Radiology Dg Chest 2 View  Result Date: 07/25/2018 CLINICAL DATA:  Cough, congestion EXAM: CHEST - 2 VIEW COMPARISON:  11/26/2017 FINDINGS: Heart and mediastinal contours are within normal limits. No focal opacities or effusions. No acute bony abnormality. IMPRESSION: No active cardiopulmonary disease. Electronically Signed   By: Charlett Nose M.D.   On: 07/25/2018 16:30    Procedures Procedures (including critical care time)  Medications Ordered in ED Medications  albuterol (PROVENTIL HFA;VENTOLIN HFA) 108 (90 Base) MCG/ACT inhaler 1 puff (1 puff Inhalation Given 07/25/18 1802)  Initial Impression / Assessment and Plan / ED Course  I have reviewed the triage vital signs and the nursing notes.  Pertinent labs & imaging results that were available during my care of the patient were reviewed by me and considered in my medical decision making (see chart for details).     37 year old male who presents for evaluation of 3 days of cough, nasal congestion, rhinorrhea, sore throat.  Reports cough is productive.  No fevers.  Today with noting some chest soreness.  He states it hurts when he takes a deep breath in.  Additionally earlier today, he had some shortness of breath.  No shortness of breath here in the ED.  Checked his vitals at work and noted his O2 was 90%.  Does report a history of vaping. Patient is afebrile, non-toxic appearing, sitting comfortably on examination table. Vital signs reviewed and stable.  On exam, lungs clear to auscultation bilaterally.  No evidence of respiratory distress.  Given  history/physical exam, this sounds like this for infectious etiology.  Consider URI versus pneumonia.  Low suspicion for PE given lack of risk factors and overall generalized appearance but also consideration.  We will plan to check d-dimer, basic labs, EKG, troponin.  Chest x-ray reviewed.  Negative for any acute infectious etiology.  Troponin negative.  BMP is unremarkable.  CBC without any significant leukocytosis or anemia.  D-dimer is negative.  Patient has a Wells score of 0.  Given low Wells score, negative d-dimer, this is reassuring.  Do not suspect PE as the source of patient's symptoms.  Patient ambulated in ED.  O2 sats remained 93 to 95% on room air.  I discussed with patient after walking.  He states he felt tired after walking but not having shortness of breath or chest pain while walking.  I suspect that this is an upper respiratory infection.  We will plan to give some albuterol to help with breathing at home. Discussed patient with Dr. Criss AlvineGoldston who is agreeable to plan.  Encourage at home supportive care measures.  Instructed patient to closely monitor symptoms and return to the ED for any further worrisome or concerning symptoms. Patient had ample opportunity for questions and discussion. All patient's questions were answered with full understanding. Strict return precautions discussed. Patient expresses understanding and agreement to plan.    Final Clinical Impressions(s) / ED Diagnoses   Final diagnoses:  Viral URI with cough    ED Discharge Orders         Ordered    benzonatate (TESSALON) 100 MG capsule  Every 8 hours     07/25/18 1755           Maxwell CaulLayden, Arvon Schreiner A, PA-C 07/25/18 2358    Pricilla LovelessGoldston, Scott, MD 07/26/18 [phone]

## 2018-07-27 ENCOUNTER — Encounter (HOSPITAL_BASED_OUTPATIENT_CLINIC_OR_DEPARTMENT_OTHER): Payer: Self-pay | Admitting: *Deleted

## 2018-07-27 ENCOUNTER — Emergency Department (HOSPITAL_BASED_OUTPATIENT_CLINIC_OR_DEPARTMENT_OTHER): Payer: BLUE CROSS/BLUE SHIELD

## 2018-07-27 ENCOUNTER — Emergency Department (HOSPITAL_BASED_OUTPATIENT_CLINIC_OR_DEPARTMENT_OTHER)
Admission: EM | Admit: 2018-07-27 | Discharge: 2018-07-27 | Disposition: A | Discharge status: Home / Self Care | Payer: BLUE CROSS/BLUE SHIELD | Attending: Emergency Medicine | Admitting: Emergency Medicine

## 2018-07-27 ENCOUNTER — Other Ambulatory Visit: Payer: Self-pay

## 2018-07-27 DIAGNOSIS — R0602 Shortness of breath: Secondary | ICD-10-CM | POA: Diagnosis present

## 2018-07-27 DIAGNOSIS — F17200 Nicotine dependence, unspecified, uncomplicated: Secondary | ICD-10-CM | POA: Insufficient documentation

## 2018-07-27 DIAGNOSIS — J209 Acute bronchitis, unspecified: Secondary | ICD-10-CM | POA: Insufficient documentation

## 2018-07-27 DIAGNOSIS — J40 Bronchitis, not specified as acute or chronic: Secondary | ICD-10-CM

## 2018-07-27 MED ORDER — DEXAMETHASONE 6 MG PO TABS
10.0000 mg | ORAL_TABLET | Freq: Once | ORAL | Status: AC
  Administered 2018-07-27: 10 mg via ORAL
  Filled 2018-07-27: qty 1

## 2018-07-27 NOTE — ED Provider Notes (Signed)
[facility] EMERGENCY DEPARTMENT Provider Note   CSN: [id number] Arrival date & time: 07/27/18  1731     History   Chief Complaint Chief Complaint  Patient presents with  . Shortness of Breath    HPI Micheal Malone is a 37 y.o. male with a history of kidney stones who presents emergency department today for 5 days of URI symptoms including rhinorrhea, sore throat, nasal congestion and a productive cough.  Patient was seen here on 9/18 for the same with reassuring work-up including chest x-ray, EKG, CBC, BMP, d-dimer and troponin.  Patient was discharged home on albuterol and Tessalon.  He reports he has been taking them as prescribed.  He notes that his symptoms have persisted.  He feels no better or worse.  He states he gets short of breath mildly with exertion.  He reports no associated fever, chills, chest pain.  Patient does admit to vape use.  He denies any exogenous testosterone use, recent immobilization, recent surgery, lower extremity swelling, hemoptysis, history of cancer, trauma, or history of PE/DVT.  HPI  Past Medical History:  Diagnosis Date  . History of kidney stones   . Left ureteral stone   . Wears glasses     There are no active problems to display for this patient.   Past Surgical History:  Procedure Laterality Date  . CYSTOSCOPY/URETEROSCOPY/HOLMIUM LASER/STENT PLACEMENT Left 02/20/2017   Procedure: CYSTOSCOPY LEFT URETEROSCOPY LEFT RETROGRADE PYELOGRAM Manning Charity LASER LITHO / STONE BASKETRY /STENT PLACEMENT;  Surgeon: Ihor Gully, MD;  Location: [facility] [place];  Service: Urology;  Laterality: Left;  . NO PAST SURGERIES    . VASECTOMY Bilateral 02/20/2017   Procedure: VASECTOMY BILATERAL;  Surgeon: Ihor Gully, MD;  Location: [facility];  Service: Urology;  Laterality: Bilateral;        Home Medications    Prior to Admission medications   Medication Sig Start Date End Date Taking? Authorizing Provider    benzonatate (TESSALON) 100 MG capsule Take 1 capsule (100 mg total) by mouth every 8 (eight) hours. 07/25/18   Maxwell Caul, PA-C  HYDROcodone-acetaminophen (NORCO/VICODIN) 5-325 MG tablet Take 1-2 tablets by mouth every 6 (six) hours as needed. 08/30/17   Ward, Layla Maw, DO  ibuprofen (ADVIL,MOTRIN) 800 MG tablet Take 1 tablet (800 mg total) by mouth 3 (three) times daily. Take with food. 08/30/17   Ward, Layla Maw, DO  phenazopyridine (PYRIDIUM) 200 MG tablet Take 1 tablet (200 mg total) by mouth 3 (three) times daily. Patient taking differently: Take 200 mg by mouth 3 (three) times daily as needed.  01/24/17   Rise Mu, PA-C    Family History Family History  Problem Relation Age of Onset  . Cerebral aneurysm Father   . Heart failure Father     Social History Social History   Tobacco Use  . Smoking status: Current Every Day Smoker    Types: E-cigarettes  . Smokeless tobacco: Former Neurosurgeon    Types: Snuff    Quit date: 02/16/2016  Substance Use Topics  . Alcohol use: Yes    Comment: occassionally  . Drug use: No     Allergies   Keflex [cephalexin]   Review of Systems Review of Systems  All other systems reviewed and are negative.    Physical Exam Updated Vital Signs BP (!) 126/94 (BP Location: Right Arm)   Pulse 71   Temp 98.1 F (36.7 C) (Oral)   Resp 20   Ht 5\' 11"  (1.803  m)   Wt 86.6 kg   SpO2 100%   BMI 26.63 kg/m   Physical Exam  Constitutional: He appears well-developed and well-nourished.  HENT:  Head: Normocephalic and atraumatic.  Right Ear: External ear normal.  Left Ear: External ear normal.  Nose: Mucosal edema present. Right sinus exhibits no maxillary sinus tenderness and no frontal sinus tenderness. Left sinus exhibits no maxillary sinus tenderness and no frontal sinus tenderness.  Mouth/Throat: Uvula is midline, oropharynx is clear and moist and mucous membranes are normal. No tonsillar exudate.  The patient has normal  phonation and is in control of secretions. No stridor.  Midline uvula without edema. Soft palate rises symmetrically.  No tonsillar erythema or exudates. No PTA. Tongue protrusion is normal. No trismus. No creptius on neck palpation and patient has good dentition. No gingival erythema or fluctuance noted. Mucus membranes moist.   Eyes: Pupils are equal, round, and reactive to light. Right eye exhibits no discharge. Left eye exhibits no discharge. No scleral icterus.  Neck: Trachea normal. Neck supple. No spinous process tenderness present. No neck rigidity. Normal range of motion present.  Cardiovascular: Normal rate, regular rhythm and intact distal pulses.  No murmur heard. Pulses:      Radial pulses are 2+ on the right side, and 2+ on the left side.       Dorsalis pedis pulses are 2+ on the right side, and 2+ on the left side.       Posterior tibial pulses are 2+ on the right side, and 2+ on the left side.  No lower extremity swelling or edema. Calves symmetric in size bilaterally.  Pulmonary/Chest: Effort normal and breath sounds normal. He exhibits no tenderness.  Patient sating at 100% on room air. No increased work of breathing. No accessory muscle use. Patient is sitting upright, speaking in full sentences without difficulty   Abdominal: Soft. Bowel sounds are normal. There is no tenderness. There is no rebound and no guarding.  Musculoskeletal: He exhibits no edema.  Lymphadenopathy:    He has no cervical adenopathy.  Neurological: He is alert.  Skin: Skin is warm and dry. No rash noted. He is not diaphoretic.  Psychiatric: He has a normal mood and affect.  Nursing note and vitals reviewed.    ED Treatments / Results  Labs (all labs ordered are listed, but only abnormal results are displayed) Labs Reviewed - No data to display  EKG None  Radiology Dg Chest 2 View  Result Date: 07/27/2018 CLINICAL DATA:  Continued cough EXAM: CHEST - 2 VIEW COMPARISON:  07/25/2018 FINDINGS:  The heart size and mediastinal contours are within normal limits. Both lungs are clear. The visualized skeletal structures are unremarkable. IMPRESSION: No active cardiopulmonary disease. Electronically Signed   By: Tollie Ethavid  Kwon M.D.   On: 07/27/2018 18:52    Procedures Procedures (including critical care time)  Medications Ordered in ED Medications  dexamethasone (DECADRON) tablet 10 mg (10 mg Oral Given 07/27/18 1912)     Initial Impression / Assessment and Plan / ED Course  I have reviewed the triage vital signs and the nursing notes.  Pertinent labs & imaging results that were available during my care of the patient were reviewed by me and considered in my medical decision making (see chart for details).     37 y.o. male presenting with symptoms consistent with bronchitis. Patient is PERC negative today. Patient had negative D-Dimer and extensive workup previously. No concern for PE. Patient without chest pain. Vital signs  wnl. Repeat chest xray unremarkable. Patient sore throat without concern for strep, PTA or RPA. Will have patient continue conservative measures and inhaler. Decadron given in department. I advised the patient to follow-up with pcp this week. Specific return precautions discussed. Time was given for all questions to be answered. The patient verbalized understanding and agreement with plan. The patient appears safe for discharge home.  Final Clinical Impressions(s) / ED Diagnoses   Final diagnoses:  Bronchitis    ED Discharge Orders    None       Princella Pellegrini 07/27/18 1936    Marily Memos, MD 07/27/18 2124

## 2018-07-27 NOTE — ED Triage Notes (Signed)
SOB. States he was here 2 days ago for same. He has a cough. No fever.

## 2019-10-14 ENCOUNTER — Ambulatory Visit
Admission: EM | Admit: 2019-10-14 | Discharge: 2019-10-14 | Disposition: A | Discharge status: Home / Self Care | Payer: BC Managed Care – PPO | Attending: Physician Assistant | Admitting: Physician Assistant

## 2019-10-14 DIAGNOSIS — J209 Acute bronchitis, unspecified: Secondary | ICD-10-CM | POA: Diagnosis not present

## 2019-10-14 MED ORDER — PREDNISONE 50 MG PO TABS: 50.0000 mg | ORAL_TABLET | Freq: Every day | ORAL | 0 refills | Status: DC

## 2019-10-14 MED ORDER — IPRATROPIUM BROMIDE 0.06 % NA SOLN: 2.0000 | Freq: Four times a day (QID) | NASAL | 0 refills | Status: DC

## 2019-10-14 NOTE — ED Provider Notes (Signed)
[facility]    CSN: [id number] Arrival date & time: 10/14/19  0071      History   Chief Complaint Chief Complaint  Patient presents with  . Cough    HPI Micheal Malone is a 38 y.o. male.   38 year old male comes in for 4 day history of URI symptoms. Has had cough, nasal congestion, rhinorrhea, bilateral ear pain, muffled hearing, body aches. Chest pain with coughing. Some dyspnea on exertion, albuterol with relief. Denies fever, chills. Denies abdominal pain, nausea, vomiting. 5 episodes of diarrhea. Denies melena, hematochezia. Denies loss of taste/smell. Negative COVID test. Positive COVID exposure. Former smoker ( few months).      Past Medical History:  Diagnosis Date  . History of kidney stones   . Left ureteral stone   . Wears glasses     There are no active problems to display for this patient.   Past Surgical History:  Procedure Laterality Date  . CYSTOSCOPY/URETEROSCOPY/HOLMIUM LASER/STENT PLACEMENT Left 02/20/2017   Procedure: CYSTOSCOPY LEFT URETEROSCOPY LEFT RETROGRADE PYELOGRAM Manning Charity LASER LITHO / STONE BASKETRY /STENT PLACEMENT;  Surgeon: Ihor Gully, MD;  Location: [facility] [place] [facility];  Service: Urology;  Laterality: Left;  . NO PAST SURGERIES    . VASECTOMY Bilateral 02/20/2017   Procedure: VASECTOMY BILATERAL;  Surgeon: Ihor Gully, MD;  Location: [facility];  Service: Urology;  Laterality: Bilateral;       Home Medications    Prior to Admission medications   Medication Sig Start Date End Date Taking? Authorizing Provider  ipratropium (ATROVENT) 0.06 % nasal spray Place 2 sprays into both nostrils 4 (four) times daily. 10/14/19   Belinda Fisher, PA-C  predniSONE (DELTASONE) 50 MG tablet Take 1 tablet (50 mg total) by mouth daily with breakfast. 10/14/19   Belinda Fisher, PA-C    Family History Family History  Problem Relation Age of Onset  . Cerebral aneurysm Father   . Heart failure Father     Social History  Social History   Tobacco Use  . Smoking status: Current Every Day Smoker    Types: E-cigarettes  . Smokeless tobacco: Former Neurosurgeon    Types: Snuff    Quit date: 02/16/2016  Substance Use Topics  . Alcohol use: Yes    Comment: occassionally  . Drug use: No     Allergies   Keflex [cephalexin]   Review of Systems Review of Systems  Reason unable to perform ROS: See HPI as above.     Physical Exam Triage Vital Signs ED Triage Vitals  Enc Vitals Group     BP 10/14/19 0832 (!) 142/99     Pulse Rate 10/14/19 0832 97     Resp 10/14/19 0832 18     Temp 10/14/19 0832 98.2 F (36.8 C)     Temp Source 10/14/19 0832 Oral     SpO2 10/14/19 0832 98 %     Weight --      Height --      Head Circumference --      Peak Flow --      Pain Score 10/14/19 0833 2     Pain Loc --      Pain Edu? --      Excl. in GC? --    No data found.  Updated Vital Signs BP (!) 142/99 (BP Location: Left Arm)   Pulse 97   Temp 98.2 F (36.8 C) (Oral)   Resp 18   SpO2 98%  Physical Exam Constitutional:      General: He is not in acute distress.    Appearance: Normal appearance. He is not ill-appearing, toxic-appearing or diaphoretic.  HENT:     Head: Normocephalic and atraumatic.     Right Ear: Tympanic membrane, ear canal and external ear normal. Tympanic membrane is not erythematous or bulging.     Left Ear: Tympanic membrane, ear canal and external ear normal. Tympanic membrane is not erythematous or bulging.     Nose:     Right Sinus: Maxillary sinus tenderness present. No frontal sinus tenderness.     Left Sinus: Maxillary sinus tenderness present. No frontal sinus tenderness.     Mouth/Throat:     Mouth: Mucous membranes are moist.     Pharynx: Oropharynx is clear. Uvula midline.  Neck:     Musculoskeletal: Normal range of motion and neck supple.  Cardiovascular:     Rate and Rhythm: Normal rate and regular rhythm.     Heart sounds: Normal heart sounds. No murmur. No friction  rub. No gallop.   Pulmonary:     Effort: Pulmonary effort is normal. No accessory muscle usage, prolonged expiration, respiratory distress or retractions.     Comments: Lungs clear to auscultation without adventitious lung sounds. Chest:     Comments: Diffuse tenderness to palpation of the chest.  Skin:    General: Skin is warm and dry.  Neurological:     General: No focal deficit present.     Mental Status: He is alert and oriented to person, place, and time.      UC Treatments / Results  Labs (all labs ordered are listed, but only abnormal results are displayed) Labs Reviewed - No data to display  EKG   Radiology No results found.  Procedures Procedures (including critical care time)  Medications Ordered in UC Medications - No data to display  Initial Impression / Assessment and Plan / UC Course  I have reviewed the triage vital signs and the nursing notes.  Pertinent labs & imaging results that were available during my care of the patient were reviewed by me and considered in my medical decision making (see chart for details).    Patient reports negative Covid testing since symptom onset.  Exam without any alarming signs.  Lungs clear to auscultation bilaterally without adventitious lung sounds.  O2 sat stable without hypoxia, tachypnea, tachycardia.  At this time, will treat for bronchitis with prednisone.  Other symptomatic treatment discussed.  Push fluids.  Return precautions given.  Patient expresses understanding and agrees to plan.  Final Clinical Impressions(s) / UC Diagnoses   Final diagnoses:  Acute bronchitis, unspecified organism   ED Prescriptions    Medication Sig Dispense Auth. Provider   predniSONE (DELTASONE) 50 MG tablet Take 1 tablet (50 mg total) by mouth daily with breakfast. 5 tablet Kolston Lacount V, PA-C   ipratropium (ATROVENT) 0.06 % nasal spray Place 2 sprays into both nostrils 4 (four) times daily. 15 mL Ok Edwards, PA-C     PDMP not reviewed  this encounter.   Ok Edwards, PA-C 10/14/19 [phone]

## 2019-10-14 NOTE — Discharge Instructions (Signed)
Prednisone as directed. Continue albuterol as needed. Start atrovent nasal spray for nasal congestion/drainage. You can use over the counter nasal saline rinse such as neti pot for nasal congestion. Keep hydrated, your urine should be clear to pale yellow in color. Tylenol/motrin for fever and pain. Monitor for any worsening of symptoms, chest pain, shortness of breath, wheezing, swelling of the throat, go to the emergency department for further evaluation needed.

## 2019-10-14 NOTE — ED Triage Notes (Signed)
Pt c/o cough, congestion, chest pain from coughing, and bilateral ear pain since last Thursday. Neg COVID test on Friday. Had a positive exposure at work 11/27.

## 2019-10-19 ENCOUNTER — Emergency Department (HOSPITAL_COMMUNITY)
Admission: EM | Admit: 2019-10-19 | Discharge: 2019-10-19 | Disposition: A | Discharge status: Home / Self Care | Payer: BC Managed Care – PPO | Attending: Emergency Medicine | Admitting: Emergency Medicine

## 2019-10-19 ENCOUNTER — Encounter (HOSPITAL_COMMUNITY): Payer: Self-pay | Admitting: Emergency Medicine

## 2019-10-19 ENCOUNTER — Emergency Department (HOSPITAL_COMMUNITY): Payer: BC Managed Care – PPO

## 2019-10-19 ENCOUNTER — Other Ambulatory Visit: Payer: Self-pay

## 2019-10-19 DIAGNOSIS — B349 Viral infection, unspecified: Secondary | ICD-10-CM

## 2019-10-19 DIAGNOSIS — R509 Fever, unspecified: Secondary | ICD-10-CM | POA: Diagnosis present

## 2019-10-19 DIAGNOSIS — F17290 Nicotine dependence, other tobacco product, uncomplicated: Secondary | ICD-10-CM | POA: Insufficient documentation

## 2019-10-19 DIAGNOSIS — U071 COVID-19: Secondary | ICD-10-CM | POA: Diagnosis not present

## 2019-10-19 LAB — RESPIRATORY PANEL BY RT PCR (FLU A&B, COVID)
Influenza A by PCR: NEGATIVE
Influenza B by PCR: NEGATIVE
SARS Coronavirus 2 by RT PCR: POSITIVE — AB

## 2019-10-19 MED ORDER — ACETAMINOPHEN 500 MG PO TABS
1000.0000 mg | ORAL_TABLET | Freq: Once | ORAL | Status: AC
  Administered 2019-10-19: 11:00:00 1000 mg via ORAL
  Filled 2019-10-19: qty 2

## 2019-10-19 NOTE — ED Triage Notes (Signed)
C/o SOB since this morning with fever, headache, nasal congestion, and cough.  States O2 sats down to 86% at home.  Just finished prednisone yesterday for bronchitis.  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  Pt had negative test 12/4.

## 2019-10-19 NOTE — ED Provider Notes (Signed)
[facility] EMERGENCY DEPARTMENT Provider Note   CSN: [id number] Arrival date & time: 10/19/19  1700     History Chief Complaint  Patient presents with  . ? COVID  . Fever  . Cough  . Shortness of Breath    Micheal Malone is a 38 y.o. male presents today for evaluation of worsening fever, shortness of breath, cough has been ongoing for about the last 8 days.  He states he initially started having symptoms about 8 days ago.  He reported that he had some cough, nasal congestion, rhinorrhea, body aches.  Patient does report that several members of his workplace have tested positive for Covid and he was exposed to them.  He had a negative Covid test that was done on 10/11/2019.  He states that since then, he has had some continued cough, congestion but today started noticing fever at home.  He reports cough is productive of phlegm.  He has had some chills also.  He feels some chest soreness associated with coughing but no chest pain.  He has had some intermittent episodes of diarrhea.  He is not having history of COPD or asthma. He denies any blood in stool, abdominal pain, nausea/vomiting.   The history is provided by the patient.       Past Medical History:  Diagnosis Date  . History of kidney stones   . Left ureteral stone   . Wears glasses     There are no problems to display for this patient.   Past Surgical History:  Procedure Laterality Date  . CYSTOSCOPY/URETEROSCOPY/HOLMIUM LASER/STENT PLACEMENT Left 02/20/2017   Procedure: CYSTOSCOPY LEFT URETEROSCOPY LEFT RETROGRADE PYELOGRAM Dorene Ar LASER LITHO / STONE BASKETRY /STENT PLACEMENT;  Surgeon: Kathie Rhodes, MD;  Location: [place];  Service: Urology;  Laterality: Left;  . NO PAST SURGERIES    . VASECTOMY Bilateral 02/20/2017   Procedure: VASECTOMY BILATERAL;  Surgeon: Kathie Rhodes, MD;  Location: [facility];  Service: Urology;  Laterality: Bilateral;       Family  History  Problem Relation Age of Onset  . Cerebral aneurysm Father   . Heart failure Father     Social History   Tobacco Use  . Smoking status: Current Every Day Smoker    Types: E-cigarettes  . Smokeless tobacco: Former Systems developer    Types: Snuff    Quit date: 02/16/2016  Substance Use Topics  . Alcohol use: Yes    Comment: occassionally  . Drug use: No    Home Medications Prior to Admission medications   Medication Sig Start Date End Date Taking? Authorizing Provider  ipratropium (ATROVENT) 0.06 % nasal spray Place 2 sprays into both nostrils 4 (four) times daily. 10/14/19   Tasia Catchings, Amy V, PA-C  predniSONE (DELTASONE) 50 MG tablet Take 1 tablet (50 mg total) by mouth daily with breakfast. 10/14/19   Tasia Catchings, Amy V, PA-C    Allergies    Keflex [cephalexin]  Review of Systems   Review of Systems  Constitutional: Positive for chills and fever.  Respiratory: Positive for cough. Negative for shortness of breath.   Cardiovascular: Negative for chest pain.  Gastrointestinal: Positive for diarrhea. Negative for abdominal pain, nausea and vomiting.  Genitourinary: Negative for dysuria and hematuria.  Musculoskeletal: Positive for myalgias.  Neurological: Negative for headaches.  All other systems reviewed and are negative.   Physical Exam Updated Vital Signs BP (!) 123/92   Pulse 97   Temp 99.8 F (37.7 C) (Oral)  Resp 18   SpO2 100%   Physical Exam Vitals and nursing note reviewed.  Constitutional:      Appearance: Normal appearance. He is well-developed.  HENT:     Head: Normocephalic and atraumatic.  Eyes:     General: Lids are normal.     Conjunctiva/sclera: Conjunctivae normal.     Pupils: Pupils are equal, round, and reactive to light.  Cardiovascular:     Rate and Rhythm: Normal rate and regular rhythm.     Pulses: Normal pulses.     Heart sounds: Normal heart sounds. No murmur. No friction rub. No gallop.   Pulmonary:     Effort: Pulmonary effort is normal.      Breath sounds: Normal breath sounds.     Comments: Lungs clear to auscultation bilaterally.  Symmetric chest rise.  No wheezing, rales, rhonchi. Abdominal:     Palpations: Abdomen is soft. Abdomen is not rigid.     Tenderness: There is no abdominal tenderness. There is no guarding.     Comments: Abdomen is soft, non-distended, non-tender. No rigidity, No guarding. No peritoneal signs.  Musculoskeletal:        General: Normal range of motion.     Cervical back: Full passive range of motion without pain.  Skin:    General: Skin is warm and dry.     Capillary Refill: Capillary refill takes less than 2 seconds.  Neurological:     Mental Status: He is alert and oriented to person, place, and time.  Psychiatric:        Speech: Speech normal.     ED Results / Procedures / Treatments   Labs (all labs ordered are listed, but only abnormal results are displayed) Labs Reviewed  RESPIRATORY PANEL BY RT PCR (FLU A&B, COVID) - Abnormal; Notable for the following components:      Result Value   SARS Coronavirus 2 by RT PCR POSITIVE (*)    All other components within normal limits    EKG None  Radiology DG Chest Portable 1 View  Result Date: 10/19/2019 CLINICAL DATA:  C/o SOB since this morning with fever, headache, nasal congestion, and cough. States O2 sats down to 86% at home. Just finished prednisone yesterday for bronchitis. 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 EXAM: PORTABLE CHEST 1 VIEW COMPARISON:  Chest radiograph 07/27/2018 FINDINGS: The heart size and mediastinal contours are within normal limits. The lungs are clear. No pneumothorax or pleural effusion. The visualized skeletal structures are unremarkable. IMPRESSION: No evidence of active disease in the chest. Electronically Signed   By: Emmaline Kluver M.D.   On: 10/19/2019 11:22    Procedures Procedures (including critical care time)  Medications Ordered in ED Medications  acetaminophen (TYLENOL) tablet 1,000 mg (1,000  mg Oral Given 10/19/19 1038)    ED Course  I have reviewed the triage vital signs and the nursing notes.  Pertinent labs & imaging results that were available during my care of the patient were reviewed by me and considered in my medical decision making (see chart for details).    MDM Rules/Calculators/A&P                       38 year old male who presents for evaluation of 8 days of generalized body aches cough, congestion, myalgias abdomen ongoing for about a week.  He was seen at urgent care and was evaluated for Covid.  He says came back negative.  He states he has continued to have  symptoms.  Today he started having fever, prompting ED visit.  Initial ED arrival, he has a low-grade temp of 99.8.  Vitals otherwise stable.  No evidence of hypoxia.  Lungs clear to auscultation.  Plan to check chest x-ray, Covid, flu.  Chest x-ray reviewed.  Negative for any infectious etiology.  Patient able to ambulate around the ED with maintaining sats 95-97% on room air.  Vitals are stable.  Discussed results with patient.  Suspect viral process such as COVID-19.  At this time, he is hemodynamically stable with no signs of hypoxia.  He is stable for discharge home.  I discussed with him regarding at home supportive care measures. At this time, patient exhibits no emergent life-threatening condition that require further evaluation in ED or admission. Patient had ample opportunity for questions and discussion. All patient's questions were answered with full understanding. Strict return precautions discussed. Patient expresses understanding and agreement to plan.   Arrion D Liao was evaluated in Emergency Department on 10/19/2019 for the symptomJoellyn Quailss described in the history of present illness. He was evaluated in the context of the global COVID-19 pandemic, which necessitated consideration that the patient might be at risk for infection with the SARS-CoV-2 virus that causes COVID-19. Institutional protocols  and algorithms that pertain to the evaluation of patients at risk for COVID-19 are in a state of rapid change based on information released by regulatory bodies including the CDC and federal and state organizations. These policies and algorithms were followed during the patient's care in the ED.  Portions of this note were generated with Scientist, clinical (histocompatibility and immunogenetics)Dragon dictation software. Dictation errors may occur despite best attempts at proofreading.   Final Clinical Impression(s) / ED Diagnoses Final diagnoses:  Viral illness    Rx / DC Orders ED Discharge Orders    None       Rosana HoesLayden, Lamaria Hildebrandt A, PA-C 10/19/19 1239    Jacalyn LefevreHaviland, Julie, MD 10/19/19 1400

## 2019-10-19 NOTE — ED Notes (Signed)
Ambulated Pt around [place] while on Pulse Ox. Pt started at 97% and stayed 95-97% while walking. Pt stated he has no problems breathing while up and walking, it is only when he is laying down. Laid Pt back in chair to check Pulse Ox. Pt currently at 93% laying down. Sitting Pt back up now.

## 2019-10-19 NOTE — ED Notes (Signed)
Discharge instructions discussed with Pt. Pt verbalized understanding. Pt stable and ambulatory.    

## 2019-10-19 NOTE — Discharge Instructions (Addendum)
You have a COVID-19 test pending.  You can check online regarding results.  You should quarantine until results come back.  Make sure you drink plenty of fluids and stay hydrated.  You can take Tylenol or Ibuprofen as directed for pain. You can alternate Tylenol and Ibuprofen every 4 hours. If you take Tylenol at 1pm, then you can take Ibuprofen at 5pm. Then you can take Tylenol again at 9pm.   Return the emergency department for any trouble breathing, vomiting, inability eat or drink, abdominal pain or any other worsening concerning symptoms.     Person Under Monitoring Name: Micheal Malone  Location: [address] [place] [address]   Infection Prevention Recommendations for Individuals Confirmed to have, or Being Evaluated for, 2019 Novel Coronavirus (COVID-19) Infection Who Receive Care at Home  Individuals who are confirmed to have, or are being evaluated for, COVID-19 should follow the prevention steps below until a healthcare provider or local or state health department says they can return to normal activities.  Stay home except to get medical care You should restrict activities outside your home, except for getting medical care. Do not go to work, school, or public areas, and do not use public transportation or taxis.  Call ahead before visiting your doctor Before your medical appointment, call the healthcare provider and tell them that you have, or are being evaluated for, COVID-19 infection. This will help the healthcare provider's office take steps to keep other people from getting infected. Ask your healthcare provider to call the local or state health department.  Monitor your symptoms Seek prompt medical attention if your illness is worsening (e.g., difficulty breathing). Before going to your medical appointment, call the healthcare provider and tell them that you have, or are being evaluated for, COVID-19 infection. Ask your healthcare provider to  call the local or state health department.  Wear a facemask You should wear a facemask that covers your nose and mouth when you are in the same room with other people and when you visit a healthcare provider. People who live with or visit you should also wear a facemask while they are in the same room with you.  Separate yourself from other people in your home As much as possible, you should stay in a different room from other people in your home. Also, you should use a separate bathroom, if available.  Avoid sharing household items You should not share dishes, drinking glasses, cups, eating utensils, towels, bedding, or other items with other people in your home. After using these items, you should wash them thoroughly with soap and water.  Cover your coughs and sneezes Cover your mouth and nose with a tissue when you cough or sneeze, or you can cough or sneeze into your sleeve. Throw used tissues in a lined trash can, and immediately wash your hands with soap and water for at least 20 seconds or use an alcohol-based hand rub.  Wash your [facility] your hands often and thoroughly with soap and water for at least 20 seconds. You can use an alcohol-based hand sanitizer if soap and water are not available and if your hands are not visibly dirty. Avoid touching your eyes, nose, and mouth with unwashed hands.   Prevention Steps for Caregivers and Household Members of Individuals Confirmed to have, or Being Evaluated for, COVID-19 Infection Being Cared for in the Home  If you live with, or provide care at home for, a person confirmed to have, or being  evaluated for, COVID-19 infection please follow these guidelines to prevent infection:  Follow healthcare provider's instructions Make sure that you understand and can help the patient follow any healthcare provider instructions for all care.  Provide for the patient's basic needs You should help the patient with basic needs in the home  and provide support for getting groceries, prescriptions, and other personal needs.  Monitor the patient's symptoms If they are getting sicker, call his or her medical provider and tell them that the patient has, or is being evaluated for, COVID-19 infection. This will help the healthcare provider's office take steps to keep other people from getting infected. Ask the healthcare provider to call the local or state health department.  Limit the number of people who have contact with the patient If possible, have only one caregiver for the patient. Other household members should stay in another home or place of residence. If this is not possible, they should stay in another room, or be separated from the patient as much as possible. Use a separate bathroom, if available. Restrict visitors who do not have an essential need to be in the home.  Keep older adults, very young children, and other sick people away from the patient Keep older adults, very young children, and those who have compromised immune systems or chronic health conditions away from the patient. This includes people with chronic heart, lung, or kidney conditions, diabetes, and cancer.  Ensure good ventilation Make sure that shared spaces in the home have good air flow, such as from an air conditioner or an opened window, weather permitting.  Wash your hands often Wash your hands often and thoroughly with soap and water for at least 20 seconds. You can use an alcohol based hand sanitizer if soap and water are not available and if your hands are not visibly dirty. Avoid touching your eyes, nose, and mouth with unwashed hands. Use disposable paper towels to dry your hands. If not available, use dedicated cloth towels and replace them when they become wet.  Wear a facemask and gloves Wear a disposable facemask at all times in the room and gloves when you touch or have contact with the patient's blood, body fluids, and/or secretions  or excretions, such as sweat, saliva, sputum, nasal mucus, vomit, urine, or feces.  Ensure the mask fits over your nose and mouth tightly, and do not touch it during use. Throw out disposable facemasks and gloves after using them. Do not reuse. Wash your hands immediately after removing your facemask and gloves. If your personal clothing becomes contaminated, carefully remove clothing and launder. Wash your hands after handling contaminated clothing. Place all used disposable facemasks, gloves, and other waste in a lined container before disposing them with other household waste. Remove gloves and wash your hands immediately after handling these items.  Do not share dishes, glasses, or other household items with the patient Avoid sharing household items. You should not share dishes, drinking glasses, cups, eating utensils, towels, bedding, or other items with a patient who is confirmed to have, or being evaluated for, COVID-19 infection. After the person uses these items, you should wash them thoroughly with soap and water.  Wash laundry thoroughly Immediately remove and wash clothes or bedding that have blood, body fluids, and/or secretions or excretions, such as sweat, saliva, sputum, nasal mucus, vomit, urine, or feces, on them. Wear gloves when handling laundry from the patient. Read and follow directions on labels of laundry or clothing items and detergent. In general,  wash and dry with the warmest temperatures recommended on the label.  Clean all areas the individual has used often Clean all touchable surfaces, such as counters, tabletops, doorknobs, bathroom fixtures, toilets, phones, keyboards, tablets, and bedside tables, every day. Also, clean any surfaces that may have blood, body fluids, and/or secretions or excretions on them. Wear gloves when cleaning surfaces the patient has come in contact with. Use a diluted bleach solution (e.g., dilute bleach with 1 part bleach and 10 parts  water) or a household disinfectant with a label that says EPA-registered for coronaviruses. To make a bleach solution at home, add 1 tablespoon of bleach to 1 quart (4 cups) of water. For a larger supply, add  cup of bleach to 1 gallon (16 cups) of water. Read labels of cleaning products and follow recommendations provided on product labels. Labels contain instructions for safe and effective use of the cleaning product including precautions you should take when applying the product, such as wearing gloves or eye protection and making sure you have good ventilation during use of the product. Remove gloves and wash hands immediately after cleaning.  Monitor yourself for signs and symptoms of illness Caregivers and household members are considered close contacts, should monitor their health, and will be asked to limit movement outside of the home to the extent possible. Follow the monitoring steps for close contacts listed on the symptom monitoring form.   ? If you have additional questions, contact your local health department or call the epidemiologist on call at [phone] (available 24/7). ? This guidance is subject to change. For the most up-to-date guidance from [facility], please refer to their website: TripMetro.huhttps://www.cdc.gov/coronavirus/2019-ncov/hcp/guidance-prevent-spread.html

## 2020-08-06 ENCOUNTER — Emergency Department (HOSPITAL_COMMUNITY): Payer: BC Managed Care – PPO

## 2020-08-06 ENCOUNTER — Observation Stay (HOSPITAL_COMMUNITY)
Admission: EM | Admit: 2020-08-06 | Discharge: 2020-08-07 | Disposition: A | Discharge status: Home / Self Care | Payer: BC Managed Care – PPO | Attending: Internal Medicine | Admitting: Internal Medicine

## 2020-08-06 ENCOUNTER — Observation Stay (HOSPITAL_COMMUNITY): Payer: BC Managed Care – PPO

## 2020-08-06 DIAGNOSIS — M940 Chondrocostal junction syndrome [Tietze]: Secondary | ICD-10-CM | POA: Diagnosis not present

## 2020-08-06 DIAGNOSIS — R079 Chest pain, unspecified: Secondary | ICD-10-CM

## 2020-08-06 DIAGNOSIS — R0789 Other chest pain: Secondary | ICD-10-CM | POA: Diagnosis present

## 2020-08-06 DIAGNOSIS — Z20822 Contact with and (suspected) exposure to covid-19: Secondary | ICD-10-CM | POA: Insufficient documentation

## 2020-08-06 DIAGNOSIS — F17290 Nicotine dependence, other tobacco product, uncomplicated: Secondary | ICD-10-CM | POA: Insufficient documentation

## 2020-08-06 LAB — CBC
HCT: 44.9 % (ref 39.0–52.0)
Hemoglobin: 15.5 g/dL (ref 13.0–17.0)
MCH: 29.9 pg (ref 26.0–34.0)
MCHC: 34.5 g/dL (ref 30.0–36.0)
MCV: 86.7 fL (ref 80.0–100.0)
Platelets: 277 10*3/uL (ref 150–400)
RBC: 5.18 MIL/uL (ref 4.22–5.81)
RDW: 11.9 % (ref 11.5–15.5)
WBC: 7.2 10*3/uL (ref 4.0–10.5)
nRBC: 0 % (ref 0.0–0.2)

## 2020-08-06 LAB — BASIC METABOLIC PANEL
Anion gap: 11 (ref 5–15)
BUN: 10 mg/dL (ref 6–20)
CO2: 23 mmol/L (ref 22–32)
Calcium: 9.2 mg/dL (ref 8.9–10.3)
Chloride: 106 mmol/L (ref 98–111)
Creatinine, Ser: 0.97 mg/dL (ref 0.61–1.24)
GFR calc Af Amer: >60 mL/min (ref >60)
GFR calc non Af Amer: >60 mL/min (ref >60)
Glucose, Bld: 122 mg/dL — ABNORMAL HIGH (ref 70–99)
Potassium: 3.6 mmol/L (ref 3.5–5.1)
Sodium: 140 mmol/L (ref 135–145)

## 2020-08-06 LAB — LIPID PANEL
Cholesterol: 227 mg/dL — ABNORMAL HIGH (ref 0–200)
HDL: 46 mg/dL (ref >40)
LDL Cholesterol: 148 mg/dL — ABNORMAL HIGH (ref 0–99)
Total CHOL/HDL Ratio: 4.9 RATIO
Triglycerides: 164 mg/dL — ABNORMAL HIGH (ref <150)
VLDL: 33 mg/dL (ref 0–40)

## 2020-08-06 LAB — HEMOGLOBIN A1C
Hgb A1c MFr Bld: 5.2 % (ref 4.8–5.6)
Mean Plasma Glucose: 102.54 mg/dL

## 2020-08-06 LAB — TROPONIN I (HIGH SENSITIVITY)
Troponin I (High Sensitivity): <2 ng/L (ref <18)
Troponin I (High Sensitivity): 2 ng/L (ref <18)

## 2020-08-06 LAB — RESPIRATORY PANEL BY RT PCR (FLU A&B, COVID)
Influenza A by PCR: NEGATIVE
Influenza B by PCR: NEGATIVE
SARS Coronavirus 2 by RT PCR: NEGATIVE

## 2020-08-06 MED ORDER — NITROGLYCERIN 0.4 MG SL SUBL
0.4000 mg | SUBLINGUAL_TABLET | SUBLINGUAL | Status: DC | PRN
  Administered 2020-08-06: 0.4 mg via SUBLINGUAL

## 2020-08-06 MED ORDER — SODIUM CHLORIDE 0.9 % IV BOLUS
250.0000 mL | Freq: Once | INTRAVENOUS | Status: AC
  Administered 2020-08-06: 250 mL via INTRAVENOUS

## 2020-08-06 MED ORDER — NITROGLYCERIN 0.4 MG SL SUBL
SUBLINGUAL_TABLET | SUBLINGUAL | Status: AC
  Filled 2020-08-06: qty 1

## 2020-08-06 MED ORDER — IOHEXOL 350 MG/ML SOLN
100.0000 mL | Freq: Once | INTRAVENOUS | Status: AC | PRN
  Administered 2020-08-06: 100 mL via INTRAVENOUS

## 2020-08-06 NOTE — ED Notes (Signed)
Patient returned from xray. No complaints at this time.  Resp panel given to phlembotomy to walk to lab.

## 2020-08-06 NOTE — H&P (Signed)
Cardiology Admission History and Physical:   Patient ID: Micheal Malone MRN: [id number]; DOB: 22-Dec-1980   Admission date: 08/06/2020  Primary Care Provider: Patient, No Pcp Per [facility] Cardiologist: No primary care provider on file.  [facility] Electrophysiologist:  None   Chief Complaint:  Chest pain  Patient Profile:   Micheal Malone is a 39 y.o. male with no significant PMH who presents with chest pain.  History of Present Illness:   Micheal Malone was in his usual state of health until about 2 days ago when he woke up with chest pressure. He was able to carry on his normal daily activities without limitation. He states the pain did not get worse with exertion. However, today his pain has been significantly worse. This evening, he states that he had severe chest pain radiating to his back and down his left arm. He called EMS and was given aspirin and SL nitro with improvement in his pain. He continues to have mild chest pressure without pain but is much improved from prior. He notes some lightheadedness when he got up and walked with EMS as well as some mild nausea. He denies any palpitations, lower leg swelling, orthopnea or PND. He is concerned given his significant family history of CAD.   Past Medical History:  Diagnosis Date  . History of kidney stones   . Left ureteral stone   . Wears glasses     Past Surgical History:  Procedure Laterality Date  . CYSTOSCOPY/URETEROSCOPY/HOLMIUM LASER/STENT PLACEMENT Left 02/20/2017   Procedure: CYSTOSCOPY LEFT URETEROSCOPY LEFT RETROGRADE PYELOGRAM Manning Charity LASER LITHO / STONE BASKETRY /STENT PLACEMENT;  Surgeon: Ihor Gully, MD;  Location: [facility];  Service: Urology;  Laterality: Left;  . NO PAST SURGERIES    . VASECTOMY Bilateral 02/20/2017   Procedure: VASECTOMY BILATERAL;  Surgeon: Ihor Gully, MD;  Location: [facility];  Service: Urology;  Laterality: Bilateral;     Medications  Prior to Admission: Prior to Admission medications   Not on File     Allergies:    Allergies  Allergen Reactions  . Bee Venom Anaphylaxis  . Keflex [Cephalexin] Hives    Social History:   Lives at home with his fiance and 8 children. No tobacco use (never smoker). Daily alcohol use (2 beers). No drug use. Works for a [employer].   Family History:   The patient's family history includes Cerebral aneurysm in his father; Heart failure in his father.   First cousin- MI at 13, deceased  Father- MI 61s, CABG  ROS:  Please see the history of present illness.  All other ROS reviewed and negative.     Physical Exam/Data:   Vitals:   08/06/20 2030 08/06/20 2045 08/06/20 2145 08/06/20 2215  BP: (!) 130/97 126/89 108/84 107/85  Pulse: 64 68 (!) 51 66  Resp: (!) 21 (!) 23 19 18   Temp:      TempSrc:      SpO2: 98% 97% 96% 94%  Weight:      Height:        Intake/Output Summary (Last 24 hours) at 08/06/2020 2307 Last data filed at 08/06/2020 2231 Gross per 24 hour  Intake 250 ml  Output --  Net 250 ml   Last 3 Weights 08/06/2020 07/27/2018 07/25/2018  Weight (lbs) 198 lb 190 lb 14.7 oz 191 lb  Weight (kg) 89.812 kg 86.6 kg 86.637 kg     Body mass index is 27.62 kg/m.  General:  Well  nourished, well developed, in no acute distress HEENT: normal Neck: no JVD Cardiac:  normal S1, S2; RRR; no murmur  Lungs:  clear to auscultation bilaterally, no wheezing, rhonchi or rales  Abd: soft, nontender, no hepatomegaly  Ext: no edema Musculoskeletal:  No deformities, BUE and BLE strength normal and equal Skin: warm and dry  Neuro:  CNs 2-12 intact, no focal abnormalities noted Psych:  Normal affect    EKG:  The ECG that was done was personally reviewed and demonstrates normal sinus rhythm with nonspecific ST segment changes unchanged from prior.   Relevant CV Studies: None  Laboratory Data:  High Sensitivity Troponin:   Recent Labs  Lab 08/06/20 1917  08/06/20 2117  TROPONINIHS <2 2      Chemistry Recent Labs  Lab 08/06/20 1917  NA 140  K 3.6  CL 106  CO2 23  GLUCOSE 122*  BUN 10  CREATININE 0.97  CALCIUM 9.2  GFRNONAA >60  GFRAA >60  ANIONGAP 11    No results for input(s): PROT, ALBUMIN, AST, ALT, ALKPHOS, BILITOT in the last 168 hours. Hematology Recent Labs  Lab 08/06/20 1917  WBC 7.2  RBC 5.18  HGB 15.5  HCT 44.9  MCV 86.7  MCH 29.9  MCHC 34.5  RDW 11.9  PLT 277   BNPNo results for input(s): BNP, PROBNP in the last 168 hours.  DDimer No results for input(s): DDIMER in the last 168 hours.   Radiology/Studies:  DG Chest 2 View  Result Date: 08/06/2020 CLINICAL DATA:  Chest pain for 3 days, shortness of breath, nonsmoker EXAM: CHEST - 2 VIEW COMPARISON:  Radiograph 10/19/2019 FINDINGS: No consolidation, features of edema, pneumothorax, or effusion. Pulmonary vascularity is normally distributed. The cardiomediastinal contours are unremarkable. No acute osseous or soft tissue abnormality. Telemetry leads overlie the chest. IMPRESSION: No acute cardiopulmonary abnormality. Electronically Signed   By: Kreg Shropshire M.D.   On: 08/06/2020 20:32   HEAR Score (for undifferentiated chest pain):    2    Assessment and Plan:   1. Chest pain- Somewhat atypical in that his pain does not worsen with activity. However, his pain does improve with SL nitro and he does have significant family history of CAD. Given his recurrent CP that resolves with SL nitro, we will admit to obs for stress testing in the morning. We will also obtain a CTA to rule out dissection given his significant back pain.  - Admit to obs - Stress test in AM - S/p aspirin 324mg  - Start aspirin 81mg  (can discontinue if stress test normal) - Check lipid panel and HgA1c - Start statin pending results of lipid panel - NPO at MN in case of need for procedure - Monitor on tele - SL nitro PRN - EKG PRN for chest pain - CTA to rule out  dissection  Severity of Illness: The appropriate patient status for this patient is OBSERVATION. Observation status is judged to be reasonable and necessary in order to provide the required intensity of service to ensure the patient's safety. The patient's presenting symptoms, physical exam findings, and initial radiographic and laboratory data in the context of their medical condition is felt to place them at decreased risk for further clinical deterioration. Furthermore, it is anticipated that the patient will be medically stable for discharge from the hospital within 2 midnights of admission. The following factors support the patient status of observation.   " The patient's presenting symptoms include chest pain " The physical exam findings include normal  heart sounds. " The initial radiographic and laboratory data are negative troponins.     For questions or updates, please contact [facility] Please consult [email or web address] for contact info under     Signed, Nicoletta Ba, MD  08/06/2020 11:07 PM

## 2020-08-06 NOTE — ED Notes (Signed)
Patient complaining of chest pain 8/10. Repeat ekg completed and orders received from Dr Freida Busman.  Dr Freida Busman ok with nitro with bp 108/84 but wants to give 250cc bolus NS

## 2020-08-06 NOTE — ED Triage Notes (Signed)
Pt bib [facility] c/o chest pain x2 days described as pressure that radiates to back and left arm. Pt alos reports slight headache and SOB on exertion. Pt reports strong family history of MI's. 324 mg aspirin and 1 of nitro SL given PTA. No known COVID exposure.

## 2020-08-06 NOTE — ED Provider Notes (Signed)
[facility] EMERGENCY DEPARTMENT Provider Note   CSN: [id number] Arrival date & time: 08/06/20  1904     History Chief Complaint  Patient presents with  . Chest Pain    Micheal Malone is a 39 y.o. male.  39 year old male presents with chest discomfort that radiated to his left arm.  States that he has been experiencing chest tightness waxing waning for the past several days but today developed into the pain that was in his back and down his arm on the left.  No fever, cough, congestion.  His palms became clammy but he denies any shortness of breath or nausea.  Does have a very strong family history of coronary artery disease.  He states he has not had any kind of cardiac testing.  Called EMS and was given aspirin nitro and states that his pain subsided completely after receiving nitroglycerin.  He is currently pain-free        Past Medical History:  Diagnosis Date  . History of kidney stones   . Left ureteral stone   . Wears glasses     There are no problems to display for this patient.   Past Surgical History:  Procedure Laterality Date  . CYSTOSCOPY/URETEROSCOPY/HOLMIUM LASER/STENT PLACEMENT Left 02/20/2017   Procedure: CYSTOSCOPY LEFT URETEROSCOPY LEFT RETROGRADE PYELOGRAM Manning Charity LASER LITHO / STONE BASKETRY /STENT PLACEMENT;  Surgeon: Ihor Gully, MD;  Location: [facility];  Service: Urology;  Laterality: Left;  . NO PAST SURGERIES    . VASECTOMY Bilateral 02/20/2017   Procedure: VASECTOMY BILATERAL;  Surgeon: Ihor Gully, MD;  Location: [facility];  Service: Urology;  Laterality: Bilateral;       Family History  Problem Relation Age of Onset  . Cerebral aneurysm Father   . Heart failure Father     Social History   Tobacco Use  . Smoking status: Current Every Day Smoker    Types: E-cigarettes  . Smokeless tobacco: Former Neurosurgeon    Types: Snuff    Quit date: 02/16/2016  Vaping Use  . Vaping Use: Every  day  Substance Use Topics  . Alcohol use: Yes    Comment: occassionally  . Drug use: No    Home Medications Prior to Admission medications   Medication Sig Start Date End Date Taking? Authorizing Provider  ipratropium (ATROVENT) 0.06 % nasal spray Place 2 sprays into both nostrils 4 (four) times daily. 10/14/19   Cathie Hoops, Amy V, PA-C  predniSONE (DELTASONE) 50 MG tablet Take 1 tablet (50 mg total) by mouth daily with breakfast. 10/14/19   Cathie Hoops, Amy V, PA-C    Allergies    Keflex [cephalexin]  Review of Systems   Review of Systems  All other systems reviewed and are negative.   Physical Exam Updated Vital Signs BP (!) 136/101 (BP Location: Left Arm)   Pulse 87   Temp 98.4 F (36.9 C) (Oral)   Resp 13   Ht 1.803 m (5\' 11" )   Wt 89.8 kg   SpO2 98%   BMI 27.62 kg/m   Physical Exam Vitals and nursing note reviewed.  Constitutional:      General: He is not in acute distress.    Appearance: Normal appearance. He is well-developed. He is not toxic-appearing.  HENT:     Head: Normocephalic and atraumatic.  Eyes:     General: Lids are normal.     Conjunctiva/sclera: Conjunctivae normal.     Pupils: Pupils are equal, round, and reactive to light.  Neck:     Thyroid: No thyroid mass.     Trachea: No tracheal deviation.  Cardiovascular:     Rate and Rhythm: Normal rate and regular rhythm.     Heart sounds: Normal heart sounds. No murmur heard.  No gallop.   Pulmonary:     Effort: Pulmonary effort is normal. No respiratory distress.     Breath sounds: Normal breath sounds. No stridor. No decreased breath sounds, wheezing, rhonchi or rales.  Abdominal:     General: Bowel sounds are normal. There is no distension.     Palpations: Abdomen is soft.     Tenderness: There is no abdominal tenderness. There is no rebound.  Musculoskeletal:        General: No tenderness. Normal range of motion.     Cervical back: Normal range of motion and neck supple.  Skin:    General: Skin is warm  and dry.     Findings: No abrasion or rash.  Neurological:     Mental Status: He is alert and oriented to person, place, and time.     GCS: GCS eye subscore is 4. GCS verbal subscore is 5. GCS motor subscore is 6.     Cranial Nerves: No cranial nerve deficit.     Sensory: No sensory deficit.  Psychiatric:        Speech: Speech normal.        Behavior: Behavior normal.     ED Results / Procedures / Treatments   Labs (all labs ordered are listed, but only abnormal results are displayed) Labs Reviewed  BASIC METABOLIC PANEL  CBC  TROPONIN I (HIGH SENSITIVITY)    EKG EKG Interpretation  Date/Time:  Thursday August 06 2020 21:30:48 EDT Ventricular Rate:  53 PR Interval:    QRS Duration: 92 QT Interval:  423 QTC Calculation: 398 R Axis:   3 Text Interpretation: Sinus rhythm Confirmed by Lorre Nick ([id number]) on 08/06/2020 10:22:22 PM   Radiology No results found.  Procedures Procedures (including critical care time)  Medications Ordered in ED Medications - No data to display  ED Course  I have reviewed the triage vital signs and the nursing notes.  Pertinent labs & imaging results that were available during my care of the patient were reviewed by me and considered in my medical decision making (see chart for details).    MDM Rules/Calculators/A&P                         Patient is EKG shows no signs of acute ischemia. For troponin negative. Patient return of his chest pain and was given nitroglycerin sublingual and responded well to this. Cardiology consulted  Final Clinical Impression(s) / ED Diagnoses Final diagnoses:  None    Rx / DC Orders ED Discharge Orders    None       Lorre Nick, MD 08/06/20 2225

## 2020-08-06 NOTE — ED Notes (Signed)
Pain level 5/10 p nitro also complaining of some dizziness.

## 2020-08-07 ENCOUNTER — Observation Stay (HOSPITAL_COMMUNITY): Payer: BC Managed Care – PPO

## 2020-08-07 DIAGNOSIS — R079 Chest pain, unspecified: Secondary | ICD-10-CM

## 2020-08-07 DIAGNOSIS — M940 Chondrocostal junction syndrome [Tietze]: Secondary | ICD-10-CM

## 2020-08-07 LAB — C-REACTIVE PROTEIN: CRP: 0.7 mg/dL (ref <1.0)

## 2020-08-07 LAB — HIV ANTIBODY (ROUTINE TESTING W REFLEX): HIV Screen 4th Generation wRfx: NONREACTIVE

## 2020-08-07 LAB — SEDIMENTATION RATE: Sed Rate: 3 mm/hr (ref 0–16)

## 2020-08-07 MED ORDER — PANTOPRAZOLE SODIUM 40 MG PO TBEC: 40.0000 mg | DELAYED_RELEASE_TABLET | Freq: Every day | ORAL | 0 refills | Status: DC

## 2020-08-07 MED ORDER — NITROGLYCERIN 0.4 MG SL SUBL
SUBLINGUAL_TABLET | SUBLINGUAL | Status: AC
  Administered 2020-08-07: 0.8 mg
  Filled 2020-08-07: qty 2

## 2020-08-07 MED ORDER — IOHEXOL 350 MG/ML SOLN
80.0000 mL | Freq: Once | INTRAVENOUS | Status: AC | PRN
  Administered 2020-08-07: 80 mL via INTRAVENOUS

## 2020-08-07 MED ORDER — ASPIRIN EC 81 MG PO TBEC
81.0000 mg | DELAYED_RELEASE_TABLET | Freq: Every day | ORAL | Status: DC
  Administered 2020-08-07: 81 mg via ORAL
  Filled 2020-08-07: qty 1

## 2020-08-07 MED ORDER — ONDANSETRON HCL 4 MG/2ML IJ SOLN: 4.0000 mg | Freq: Four times a day (QID) | INTRAMUSCULAR | Status: DC | PRN

## 2020-08-07 MED ORDER — IBUPROFEN 800 MG PO TABS: 800.0000 mg | ORAL_TABLET | Freq: Three times a day (TID) | ORAL | 0 refills | Status: AC

## 2020-08-07 MED ORDER — NITROGLYCERIN 0.4 MG SL SUBL: 0.4000 mg | SUBLINGUAL_TABLET | SUBLINGUAL | Status: DC | PRN

## 2020-08-07 MED ORDER — ACETAMINOPHEN 325 MG PO TABS: 650.0000 mg | ORAL_TABLET | ORAL | Status: DC | PRN

## 2020-08-07 MED ORDER — ATORVASTATIN CALCIUM 40 MG PO TABS
40.0000 mg | ORAL_TABLET | Freq: Every day | ORAL | Status: DC
  Administered 2020-08-07: 40 mg via ORAL
  Filled 2020-08-07: qty 1

## 2020-08-07 NOTE — ED Notes (Signed)
bfast ordered 

## 2020-08-07 NOTE — ED Notes (Signed)
Patient transported to CT 

## 2020-08-07 NOTE — Progress Notes (Signed)
Cardiology Progress Note  Patient ID: Micheal Malone MRN: [id number] DOB: Apr 13, 1981 Date of Encounter: 08/07/2020  Primary Cardiologist: No primary care provider on file.  Subjective   Chief Complaint: No chest pain.  HPI: Admitted with constant chest tightness.  Troponins negative.  EKG normal.  CTA dissection protocol negative.  Symptoms are atypical for cardiac pain.  Coronary CTA ordered.  ROS:  All other ROS reviewed and negative. Pertinent positives noted in the HPI.     Inpatient Medications  Scheduled Meds: . aspirin EC  81 mg Oral Daily  . atorvastatin  40 mg Oral Daily   Continuous Infusions:  PRN Meds: acetaminophen, nitroGLYCERIN, ondansetron (ZOFRAN) IV   Vital Signs   Vitals:   08/07/20 0500 08/07/20 0600 08/07/20 0700 08/07/20 0800  BP: 110/90 117/83 117/77 112/85  Pulse: (!) 44 (!) 59 (!) 58 (!) 50  Resp: 15 18 19 17   Temp:      TempSrc:      SpO2: 95% 96% 96% 96%  Weight:      Height:        Intake/Output Summary (Last 24 hours) at 08/07/2020 0835 Last data filed at 08/06/2020 2231 Gross per 24 hour  Intake 250 ml  Output --  Net 250 ml   Last 3 Weights 08/06/2020 07/27/2018 07/25/2018  Weight (lbs) 198 lb 190 lb 14.7 oz 191 lb  Weight (kg) 89.812 kg 86.6 kg 86.637 kg      Telemetry  Overnight telemetry shows sinus bradycardia heart rate 50 to 60 bpm range, which I personally reviewed.   ECG  The most recent ECG shows sinus bradycardia, heart rate 53, no acute ischemic changes, no evidence of prior infarction, which I personally reviewed.   Physical Exam   Vitals:   08/07/20 0500 08/07/20 0600 08/07/20 0700 08/07/20 0800  BP: 110/90 117/83 117/77 112/85  Pulse: (!) 44 (!) 59 (!) 58 (!) 50  Resp: 15 18 19 17   Temp:      TempSrc:      SpO2: 95% 96% 96% 96%  Weight:      Height:         Intake/Output Summary (Last 24 hours) at 08/07/2020 0835 Last data filed at 08/06/2020 2231 Gross per 24 hour  Intake 250 ml  Output --  Net 250  ml    Last 3 Weights 08/06/2020 07/27/2018 07/25/2018  Weight (lbs) 198 lb 190 lb 14.7 oz 191 lb  Weight (kg) 89.812 kg 86.6 kg 86.637 kg    Body mass index is 27.62 kg/m.  General: Well nourished, well developed, in no acute distress Head: Atraumatic, normal size  Eyes: PEERLA, EOMI  Neck: Supple, no JVD Endocrine: No thryomegaly Cardiac: Normal S1, S2; RRR; no murmurs, rubs, or gallops Lungs: Clear to auscultation bilaterally, no wheezing, rhonchi or rales  Abd: Soft, nontender, no hepatomegaly  Ext: No edema, pulses 2+ Musculoskeletal: No deformities, BUE and BLE strength normal and equal Skin: Warm and dry, no rashes   Neuro: Alert and oriented to person, place, time, and situation, CNII-XII grossly intact, no focal deficits  Psych: Normal mood and affect   Labs  High Sensitivity Troponin:   Recent Labs  Lab 08/06/20 1917 08/06/20 2117  TROPONINIHS <2 2     Cardiac EnzymesNo results for input(s): TROPONINI in the last 168 hours. No results for input(s): TROPIPOC in the last 168 hours.  Chemistry Recent Labs  Lab 08/06/20 1917  NA 140  K 3.6  CL 106  CO2 23  GLUCOSE 122*  BUN 10  CREATININE 0.97  CALCIUM 9.2  GFRNONAA >60  GFRAA >60  ANIONGAP 11    Hematology Recent Labs  Lab 08/06/20 1917  WBC 7.2  RBC 5.18  HGB 15.5  HCT 44.9  MCV 86.7  MCH 29.9  MCHC 34.5  RDW 11.9  PLT 277   BNPNo results for input(s): BNP, PROBNP in the last 168 hours.  DDimer No results for input(s): DDIMER in the last 168 hours.   Radiology  DG Chest 2 View  Result Date: 08/06/2020 CLINICAL DATA:  Chest pain for 3 days, shortness of breath, nonsmoker EXAM: CHEST - 2 VIEW COMPARISON:  Radiograph 10/19/2019 FINDINGS: No consolidation, features of edema, pneumothorax, or effusion. Pulmonary vascularity is normally distributed. The cardiomediastinal contours are unremarkable. No acute osseous or soft tissue abnormality. Telemetry leads overlie the chest. IMPRESSION: No acute  cardiopulmonary abnormality. Electronically Signed   By: Kreg Shropshire M.D.   On: 08/06/2020 20:32   CT Angio Chest/Abd/Pel for Dissection W and/or W/WO  Result Date: 08/06/2020 CLINICAL DATA:  Chest and back pain, aortic dissection suspected, chest pain for 2 days radiating to back and left arm EXAM: CT ANGIOGRAPHY CHEST, ABDOMEN AND PELVIS TECHNIQUE: Non-contrast CT of the chest was initially obtained. Multidetector CT imaging through the chest, abdomen and pelvis was performed using the standard protocol during bolus administration of intravenous contrast. Multiplanar reconstructed images and MIPs were obtained and reviewed to evaluate the vascular anatomy. CONTRAST:  OMNIPAQUE IOHEXOL 350 MG/ML SOLN COMPARISON:  CT abdomen pelvis 10/26/2016 FINDINGS: CTA CHEST FINDINGS Cardiovascular: Noncontrast CT of the chest was performed initially. Demonstrates a normal caliber aorta without abnormal hyperdense mural thickening to suggest intramural hematoma. Postcontrast administration, there is satisfactory opacification of the thoracic aorta. The aortic root and portion of the ascending aorta may be suboptimally assessed given cardiac pulsation artifact. No acute luminal abnormality of the imaged aorta. No periaortic stranding or hemorrhage. Normal 3 vessel branching of the aortic arch. Proximal great vessels are unremarkable. Normal heart size. No pericardial effusion. Satisfactory opacification the pulmonary arteries to the segmental level. No pulmonary artery filling defects are identified. Central pulmonary arteries are normal caliber. Mediastinum/Nodes: Wedge-shaped region of fatty stippling in the anterior mediastinum may reflect remnant thymic tissue. No mediastinal fluid or gas. Normal thyroid gland and thoracic inlet. No acute abnormality of the trachea or esophagus. No worrisome mediastinal, hilar or axillary adenopathy. Lungs/Pleura: Minimal atelectasis posteriorly. No consolidation, features of  edema, pneumothorax, or effusion. No suspicious pulmonary nodules or masses. Musculoskeletal: No chest wall abnormality. No acute or significant osseous findings. Review of the MIP images confirms the above findings. CTA ABDOMEN AND PELVIS FINDINGS VASCULAR Aorta: Normal caliber aorta without aneurysm, dissection, vasculitis or significant stenosis. Celiac: Patent without evidence of aneurysm, dissection, vasculitis or significant stenosis. SMA: Patent without evidence of aneurysm, dissection, vasculitis or significant stenosis. Renals: Single right and paired left renal arteries. The renal arteries are patent without evidence of aneurysm, dissection, vasculitis, fibromuscular dysplasia or significant stenosis. IMA: Motion artifact near the ostium. No convincing stenosis, evidence of aneurysm, dissection or vasculitis. Inflow: Minimal plaque in the right internal iliac artery just proximal to the branching of the anterior and posterior divisions without significant stenosis or occlusion. No acute luminal abnormality. No aneurysm or ectasia. No CT features of vasculitis. Proximal outflow vasculature including the common, superficial and deep femoral arteries are unremarkable. Veins: No obvious venous abnormality within the limitations of this arterial phase study. Review of the MIP  images confirms the above findings. NON-VASCULAR Hepatobiliary: No worrisome focal liver lesions. Smooth liver surface contour. Normal hepatic attenuation. Normal gallbladder and biliary tree. Pancreas: Unremarkable. No pancreatic ductal dilatation or surrounding inflammatory changes. Spleen: 'Psychadelic' enhancement pattern of the spleen is typical for the arterial phase of imaging. Normal splenic size. No concerning focal splenic lesion. Adrenals/Urinary Tract: Normal adrenal glands. Kidneys enhance symmetrically and uniformly. No concerning renal mass, urolithiasis or hydronephrosis. Urinary bladder is unremarkable. Stomach/Bowel:  Distal esophagus, stomach and duodenal sweep are unremarkable. No small bowel wall thickening or dilatation. No evidence of obstruction. A normal appendix is visualized. No colonic dilatation or wall thickening. Lymphatic: No suspicious or enlarged lymph nodes in the included lymphatic chains. Reproductive: The prostate and seminal vesicles are unremarkable. Other: No abdominopelvic free fluid or free gas. No bowel containing hernias. Tiny fat containing right inguinal and umbilical hernias are present. Musculoskeletal: No acute or significant osseous findings. Musculature appears normal and symmetric. Review of the MIP images confirms the above findings. IMPRESSION: 1. No evidence of acute aortic syndrome. 2. No acute intrathoracic or intra-abdominal process. 3. Wedge-shaped region of fatty stippling in the anterior mediastinum, likely thymic remnant. Electronically Signed   By: Kreg Shropshire M.D.   On: 08/06/2020 23:46    Cardiac Studies  CTA dissection negative   Patient Profile  Micheal Malone is a 39 y.o. male without medical history who was admitted overnight on 12/07/2019 for chest pain.  Assessment & Plan   1.  Chest pain -EKG normal.  Troponin negative x2.  CTA chest abdomen pelvis without dissection.  Story is inconsistent with cardiac pain that is quite atypical. -Pain reported to improved with nitroglycerin.  Family history of heart disease. -Cardiovascular exam normal. -We will proceed with coronary CTA today.  I have a low suspicion for cardiology. -Symptoms are consistent with pericarditis.  I ordered sed rate and CRP just to be sure. 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  He will need follow-up with his.  I do not explain his symptoms. -Echo pending as well. -Possible discharge this afternoon.  2.  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  Will need to have this evaluated by CT surgery in the outpatient setting.  FEN -No intravenous  fluids -Regular diet -Code: Full -DVT PPx: SCDs  For questions or updates, please contact [facility] Please consult [email or web address] for contact info under   Time Spent with Patient: I have spent a total of 25 minutes with patient reviewing hospital notes, telemetry, EKGs, labs and examining the patient as well as establishing an assessment and plan that was discussed with the patient.  > 50% of time was spent in direct patient care.    Signed, Lenna Gilford. Flora Lipps, MD [facility]  [facility]  08/07/2020 8:35 AM

## 2020-08-07 NOTE — Discharge Summary (Signed)
Discharge Summary    Patient ID: Micheal Malone MRN: [id number]; DOB: 1981-02-12  Admit date: 08/06/2020 Discharge date: 08/07/2020  Primary Care Provider: Patient, No Pcp Per  Primary Cardiologist: Reatha Harps, MD   Discharge Diagnoses    Principal Problem:   Chest pain   Chostochondritis   Diagnostic Studies/Procedures    Coronary CT 08/07/20 IMPRESSION: 1. Coronary calcium score of 0.  2. Normal coronary origin with right dominance.  3. Normal coronary arteries.  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   History of Present Illness     Micheal Malone is a 39 y.o. male with no significant PMH who presents with chest pain.  Mr. Zanin was in his usual state of health until about 2 days ago when he woke up with chest pressure. He was able to carry on his normal daily activities without limitation. He stated the pain did not get worse with exertion. However, on day of presentation his pain has been significantly worse. Chest pain radiating to his back and down his left arm. He called EMS and was given aspirin and SL nitro with improvement in his pain. He continued to have mild chest pressure without pain but is much improved from prior. He notes some lightheadedness when he got up and walked with EMS as well as some mild nausea. He denies any palpitations, lower leg swelling, orthopnea or PND. He is concerned given his significant family history of CAD.   Hospital Course     Consultants: None  1.  Chest pain/chostocondritis  -EKG normal. Troponin negative x2.  CTA chest abdomen pelvis without dissection.  Story felt atypical however pin reported to improved with nitroglycerin.  Family history of heart disease. Coronary CT showed no evidence of CAD. Normal CPR. He was given Ibuprofen and protonix for costochondritis. No recurrent pain while admitted.   2.  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  Likely outpatient CT surgery   Evaluation.  Did the patient have an acute coronary syndrome (MI, NSTEMI, STEMI, etc) this admission?:  No                               Did the patient have a percutaneous coronary intervention (stent / angioplasty)?:  No.   _____________  Discharge Vitals Blood pressure 111/75, pulse (!) 50, temperature 98 F (36.7 C), temperature source Oral, resp. rate 18, height 5\' 11"  (1.803 m), weight 91.4 kg, SpO2 98 %.  Filed Weights   08/06/20 1916 08/07/20 1544  Weight: 89.8 kg 91.4 kg    Labs & Radiologic Studies    CBC Recent Labs    08/06/20 1917  WBC 7.2  HGB 15.5  HCT 44.9  MCV 86.7  PLT 277   Basic Metabolic Panel Recent Labs    95/28/41 1917  NA 140  K 3.6  CL 106  CO2 23  GLUCOSE 122*  BUN 10  CREATININE 0.97  CALCIUM 9.2   High Sensitivity Troponin:   Recent Labs  Lab 08/06/20 1917 08/06/20 2117  TROPONINIHS <2 2   Hemoglobin A1C Recent Labs    08/06/20 1928  HGBA1C 5.2   Fasting Lipid Panel Recent Labs    08/06/20 1928  CHOL 227*  HDL 46  LDLCALC 148*  TRIG 164*  CHOLHDL 4.9  _____________  DG Chest 2 View  Result Date: 08/06/2020 CLINICAL DATA:  Chest pain for 3 days, shortness of breath, nonsmoker EXAM: CHEST -  2 VIEW COMPARISON:  Radiograph 10/19/2019 FINDINGS: No consolidation, features of edema, pneumothorax, or effusion. Pulmonary vascularity is normally distributed. The cardiomediastinal contours are unremarkable. No acute osseous or soft tissue abnormality. Telemetry leads overlie the chest. IMPRESSION: No acute cardiopulmonary abnormality. Electronically Signed   By: Kreg Shropshire M.D.   On: 08/06/2020 20:32   CT CORONARY MORPH W/CTA COR W/SCORE W/CA W/CM &/OR WO/CM  Addendum Date: 08/07/2020   ADDENDUM REPORT: 08/07/2020 15:56 CLINICAL DATA:  Chest pain EXAM: Cardiac/Coronary CTA TECHNIQUE: The patient was scanned on a [employer]. A 100 kV prospective scan was triggered in the descending thoracic aorta at 111 HU's. Axial  non-contrast 3 mm slices were carried out through the heart. The data set was analyzed on a dedicated work station and scored using the Agatson method. Gantry rotation speed was 250 msecs and collimation was .6 mm. No beta blockade and 0.8 mg of sl NTG was given. The 3D data set was reconstructed in 5% intervals of the 35-75 % of the R-R cycle. Diastolic phases were analyzed on a dedicated work station using MPR, MIP and VRT modes. The patient received 80 cc of contrast. FINDINGS: Image quality: excellent. Noise artifact is: Limited. Coronary Arteries:  Normal coronary origin.  Right dominance. Left main: The left main is a large caliber vessel with a normal take off from the left coronary cusp that trifurcates into a LAD, LCX, and ramus intermedius. There is no plaque or stenosis. Left anterior descending artery: The LAD is patent without evidence of plaque or stenosis. There is a distal LAD myocardial bridge. The LAD gives off 1 patent diagonal branches. Ramus intermedius: Patent with no evidence of plaque or stenosis. Left circumflex artery: The LCX is non-dominant and patent with no evidence of plaque or stenosis. The LCX gives off 2 patent obtuse marginal branches. Right coronary artery: The RCA is dominant with normal take off from the right coronary cusp. There is no evidence of plaque or stenosis. The RCA terminates as a PDA and right posterolateral branch without evidence of plaque or stenosis. Right Atrium: Right atrial size is within normal limits. Right Ventricle: The right ventricular cavity is within normal limits. Left Atrium: Left atrial size is normal in size with no left atrial appendage filling defect. Left Ventricle: The ventricular cavity size is within normal limits. There are no stigmata of prior infarction. There is no abnormal filling defect. Pulmonary arteries: Normal in size without proximal filling defect. Pulmonary veins: Normal pulmonary venous drainage. Pericardium: Normal thickness  with no significant effusion or calcium present. Cardiac valves: The aortic valve is trileaflet without significant calcification. The mitral valve is normal structure without significant calcification. Aorta: Normal caliber with no significant disease. Extra-cardiac findings: See attached radiology report for non-cardiac structures. IMPRESSION: 1. Coronary calcium score of 0. 2. Normal coronary origin with right dominance. 3. Normal coronary arteries. 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 RECOMMENDATIONS: 1. No evidence of CAD (0%). Consider non-atherosclerotic causes of chest pain. Lennie Odor, MD Electronically Signed   By: Lennie Odor   On: 08/07/2020 15:56   Result Date: 08/07/2020 EXAM: OVER-READ INTERPRETATION  CT CHEST The following report is an over-read performed by radiologist Dr. Charlett Nose of [facility] on 08/07/2020. This over-read does not include interpretation of cardiac or coronary anatomy or pathology. The coronary CTA interpretation by the cardiologist is attached. COMPARISON:  None. FINDINGS: Vascular: Heart is normal size. Aorta normal caliber. No filling defects in the visualized pulmonary arteries.  Mediastinum/Nodes: No adenopathy. Lungs/Pleura: No confluent opacities or effusions. Upper Abdomen: Imaging into the upper abdomen demonstrates no acute findings. Musculoskeletal: Chest wall soft tissues are unremarkable. No acute bony abnormality. IMPRESSION: No acute or significant extra cardiac abnormality. Electronically Signed: By: Charlett Nose M.D. On: 08/07/2020 12:13   CT Angio Chest/Abd/Pel for Dissection W and/or W/WO  Result Date: 08/06/2020 CLINICAL DATA:  Chest and back pain, aortic dissection suspected, chest pain for 2 days radiating to back and left arm EXAM: CT ANGIOGRAPHY CHEST, ABDOMEN AND PELVIS TECHNIQUE: Non-contrast CT of the chest was initially obtained. Multidetector CT imaging through the chest, abdomen and pelvis was performed using  the standard protocol during bolus administration of intravenous contrast. Multiplanar reconstructed images and MIPs were obtained and reviewed to evaluate the vascular anatomy. CONTRAST:  OMNIPAQUE IOHEXOL 350 MG/ML SOLN COMPARISON:  CT abdomen pelvis 10/26/2016 FINDINGS: CTA CHEST FINDINGS Cardiovascular: Noncontrast CT of the chest was performed initially. Demonstrates a normal caliber aorta without abnormal hyperdense mural thickening to suggest intramural hematoma. Postcontrast administration, there is satisfactory opacification of the thoracic aorta. The aortic root and portion of the ascending aorta may be suboptimally assessed given cardiac pulsation artifact. No acute luminal abnormality of the imaged aorta. No periaortic stranding or hemorrhage. Normal 3 vessel branching of the aortic arch. Proximal great vessels are unremarkable. Normal heart size. No pericardial effusion. Satisfactory opacification the pulmonary arteries to the segmental level. No pulmonary artery filling defects are identified. Central pulmonary arteries are normal caliber. Mediastinum/Nodes: Wedge-shaped region of fatty stippling in the anterior mediastinum may reflect remnant thymic tissue. No mediastinal fluid or gas. Normal thyroid gland and thoracic inlet. No acute abnormality of the trachea or esophagus. No worrisome mediastinal, hilar or axillary adenopathy. Lungs/Pleura: Minimal atelectasis posteriorly. No consolidation, features of edema, pneumothorax, or effusion. No suspicious pulmonary nodules or masses. Musculoskeletal: No chest wall abnormality. No acute or significant osseous findings. Review of the MIP images confirms the above findings. CTA ABDOMEN AND PELVIS FINDINGS VASCULAR Aorta: Normal caliber aorta without aneurysm, dissection, vasculitis or significant stenosis. Celiac: Patent without evidence of aneurysm, dissection, vasculitis or significant stenosis. SMA: Patent without evidence of aneurysm, dissection,  vasculitis or significant stenosis. Renals: Single right and paired left renal arteries. The renal arteries are patent without evidence of aneurysm, dissection, vasculitis, fibromuscular dysplasia or significant stenosis. IMA: Motion artifact near the ostium. No convincing stenosis, evidence of aneurysm, dissection or vasculitis. Inflow: Minimal plaque in the right internal iliac artery just proximal to the branching of the anterior and posterior divisions without significant stenosis or occlusion. No acute luminal abnormality. No aneurysm or ectasia. No CT features of vasculitis. Proximal outflow vasculature including the common, superficial and deep femoral arteries are unremarkable. Veins: No obvious venous abnormality within the limitations of this arterial phase study. Review of the MIP images confirms the above findings. NON-VASCULAR Hepatobiliary: No worrisome focal liver lesions. Smooth liver surface contour. Normal hepatic attenuation. Normal gallbladder and biliary tree. Pancreas: Unremarkable. No pancreatic ductal dilatation or surrounding inflammatory changes. Spleen: 'Psychadelic' enhancement pattern of the spleen is typical for the arterial phase of imaging. Normal splenic size. No concerning focal splenic lesion. Adrenals/Urinary Tract: Normal adrenal glands. Kidneys enhance symmetrically and uniformly. No concerning renal mass, urolithiasis or hydronephrosis. Urinary bladder is unremarkable. Stomach/Bowel: Distal esophagus, stomach and duodenal sweep are unremarkable. No small bowel wall thickening or dilatation. No evidence of obstruction. A normal appendix is visualized. No colonic dilatation or wall thickening. Lymphatic: No suspicious or enlarged lymph nodes  in the included lymphatic chains. Reproductive: The prostate and seminal vesicles are unremarkable. Other: No abdominopelvic free fluid or free gas. No bowel containing hernias. Tiny fat containing right inguinal and umbilical hernias are  present. Musculoskeletal: No acute or significant osseous findings. Musculature appears normal and symmetric. Review of the MIP images confirms the above findings. IMPRESSION: 1. No evidence of acute aortic syndrome. 2. No acute intrathoracic or intra-abdominal process. 3. Wedge-shaped region of fatty stippling in the anterior mediastinum, likely thymic remnant. Electronically Signed   By: Kreg Shropshire M.D.   On: 08/06/2020 23:46   Disposition   Pt is being discharged home today in good condition.  Follow-up Plans & Appointments     Follow-up Information    O'Neal, Ronnald Ramp, MD Follow up on 08/24/2020.   Specialties: Internal Medicine, Cardiology, Radiology Why: @8 :20am for hospital follow up. Please arrive 15 minutes early  Contact information: [address] [place] [address] [phone]              Discharge Instructions    Diet - low sodium heart healthy   Complete by: As directed    Increase activity slowly   Complete by: As directed       Discharge Medications   Allergies as of 08/07/2020      Reactions   Bee Venom Anaphylaxis   Keflex [cephalexin] Hives      Medication List    TAKE these medications   ibuprofen 800 MG tablet Commonly known as: ADVIL Take 1 tablet (800 mg total) by mouth 3 (three) times daily for 7 days.   pantoprazole 40 MG tablet Commonly known as: Protonix Take 1 tablet (40 mg total) by mouth daily.          Outstanding Labs/Studies   None  Duration of Discharge Encounter   Greater than 30 minutes including physician time.  Lorelei Pont, PA 08/07/2020, 4:19 PM

## 2020-08-08 DIAGNOSIS — R0602 Shortness of breath: Secondary | ICD-10-CM

## 2020-08-18 ENCOUNTER — Telehealth: Payer: Self-pay | Admitting: Cardiovascular Disease

## 2020-08-18 MED ORDER — ALBUTEROL SULFATE HFA 108 (90 BASE) MCG/ACT IN AERS: 2.0000 | INHALATION_SPRAY | Freq: Four times a day (QID) | RESPIRATORY_TRACT | 2 refills | Status: DC | PRN

## 2020-08-18 NOTE — Telephone Encounter (Signed)
Called to schedule PFT and COVID prescreening that was ordered by Dr. Marissa Nestle voice mail has not been set up---will sent My Chart message

## 2020-08-23 NOTE — Progress Notes (Signed)
Cardiology Office Note:   Date:  08/24/2020  NAME:  Micheal Malone    MRN: [id number] DOB:  Nov 12, 1980   PCP:  Patient, No Pcp Per  Cardiologist:  Reatha Harps, MD   Referring MD: No ref. provider found   Chief Complaint  Patient presents with   Chest Pain   History of Present Illness:   Micheal Malone is a 39 y.o. male without medical history who presents for follow-up of chest pain. Admitted 08/06/2020 for CP. Troponin negative. CT negative for CAD. No dissection. ESR and CRP normal. EKG normal without acute changes. Reports he is still having chest tightness.  Occurs in the morning.  This is now going on for several weeks.  He reports he does get better as the day goes on.  There is no association with position change.  It is not worse or better when he lays back or leans forward.  He reports no improvement with acid reflux medication.  He did do a course of ibuprofen without any improvement.  Inflammatory markers were negative.  We did start him on an inhaler due to his shortness of breath.  He reports improvement in this with exertion.  Exertional shortness of breath been going on with the chest tightness.  We did discuss he did have residual thymic tissue noted in the anterior mediastinum.  We will refer him to thoracic surgery today.  Likely need a biopsy.  He has no symptoms of lymphoma.  He is really pretty healthy.  No significant medical problems.  He is awaiting to undergo pulmonary function testing.  He does report he is having the tightness in his chest today.  I am able to reproduce some of it when I press on his chest.   Past Medical History: Past Medical History:  Diagnosis Date   History of kidney stones    Left ureteral stone    Wears glasses     Past Surgical History: Past Surgical History:  Procedure Laterality Date   CYSTOSCOPY/URETEROSCOPY/HOLMIUM LASER/STENT PLACEMENT Left 02/20/2017   Procedure: CYSTOSCOPY LEFT URETEROSCOPY LEFT RETROGRADE PYELOGRAM  [place] LASER LITHO / STONE BASKETRY /STENT PLACEMENT;  Surgeon: Ihor Gully, MD;  Location: [facility] [place];  Service: Urology;  Laterality: Left;   NO PAST SURGERIES     VASECTOMY Bilateral 02/20/2017   Procedure: VASECTOMY BILATERAL;  Surgeon: Ihor Gully, MD;  Location: [facility];  Service: Urology;  Laterality: Bilateral;    Current Medications: Current Meds  Medication Sig   albuterol (VENTOLIN HFA) 108 (90 Base) MCG/ACT inhaler Inhale 2 puffs into the lungs every 6 (six) hours as needed for wheezing or shortness of breath.   pantoprazole (PROTONIX) 40 MG tablet Take 1 tablet (40 mg total) by mouth daily.     Allergies:    Bee venom and Keflex [cephalexin]   Social History: Social History   Socioeconomic History   Marital status: Single    Spouse name: Not on file   Number of children: Not on file   Years of education: Not on file   Highest education level: Not on file  Occupational History   Not on file  Tobacco Use   Smoking status: Current Every Day Smoker    Types: E-cigarettes   Smokeless tobacco: Former Neurosurgeon    Types: Snuff    Quit date: 02/16/2016  Vaping Use   Vaping Use: Every day  Substance and Sexual Activity   Alcohol use: Yes    Comment: occassionally  Drug use: No   Sexual activity: Not on file  Other Topics Concern   Not on file  Social History Narrative   Not on file   Social Determinants of Health   Financial Resource Strain:    Difficulty of Paying Living Expenses: Not on file  Food Insecurity:    Worried About Running Out of Food in the Last Year: Not on file   Ran Out of Food in the Last Year: Not on file  Transportation Needs:    Lack of Transportation (Medical): Not on file   Lack of Transportation (Non-Medical): Not on file  Physical Activity:    Days of Exercise per Week: Not on file   Minutes of Exercise per Session: Not on file  Stress:    Feeling of Stress : Not on file    Social Connections:    Frequency of Communication with Friends and Family: Not on file   Frequency of Social Gatherings with Friends and Family: Not on file   Attends Religious Services: Not on file   Active Member of Clubs or Organizations: Not on file   Attends Banker Meetings: Not on file   Marital Status: Not on file     Family History: The patient's family history includes Cerebral aneurysm in his father; Heart failure in his father.  ROS:   All other ROS reviewed and negative. Pertinent positives noted in the HPI.     EKGs/Labs/Other Studies Reviewed:   The following studies were personally reviewed by me today:  CCTA 08/07/2020 IMPRESSION: 1. Coronary calcium score of 0.  2. Normal coronary origin with right dominance.  3. Normal coronary arteries.  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  RECOMMENDATIONS: 1. No evidence of CAD (0%). Consider non-atherosclerotic causes of chest pain.  CTA CAP 08/06/2020 IMPRESSION: 1. No evidence of acute aortic syndrome. 2. No acute intrathoracic or intra-abdominal process. 3. Wedge-shaped region of fatty stippling in the anterior mediastinum, likely thymic remnant.   Recent Labs: 08/06/2020: BUN 10; Creatinine, Ser 0.97; Hemoglobin 15.5; Platelets 277; Potassium 3.6; Sodium 140   Recent Lipid Panel    Component Value Date/Time   CHOL 227 (H) 08/06/2020 1928   TRIG 164 (H) 08/06/2020 1928   HDL 46 08/06/2020 1928   CHOLHDL 4.9 08/06/2020 1928   VLDL 33 08/06/2020 1928   LDLCALC 148 (H) 08/06/2020 1928    Physical Exam:   VS:  BP 130/84    Pulse 84    Ht 5\' 11"  (1.803 m)    Wt 197 lb 3.2 oz (89.4 kg)    SpO2 99%    BMI 27.50 kg/m    Wt Readings from Last 3 Encounters:  08/24/20 197 lb 3.2 oz (89.4 kg)  08/07/20 201 lb 8 oz (91.4 kg)  07/27/18 190 lb 14.7 oz (86.6 kg)    General: Well nourished, well developed, in no acute distress Heart: Atraumatic, normal size  Eyes: PEERLA,  EOMI  Neck: Supple, no JVD Endocrine: No thryomegaly Cardiac: Normal S1, S2; RRR; no murmurs, rubs, or gallops Lungs: Clear to auscultation bilaterally, no wheezing, rhonchi or rales  Abd: Soft, nontender, no hepatomegaly  Ext: No edema, pulses 2+ Musculoskeletal: No deformities, BUE and BLE strength normal and equal Skin: Warm and dry, no rashes   Neuro: Alert and oriented to person, place, time, and situation, CNII-XII grossly intact, no focal deficits  Psych: Normal mood and affect   ASSESSMENT:   Micheal Malone is a 39 y.o. male who  presents for the following: 1. Chest pain, unspecified type   2. SOB (shortness of breath) on exertion   3. Accessory thymic tissue     PLAN:   1. Chest pain, unspecified type 2. SOB (shortness of breath) on exertion -Recent admission to the hospital with chest tightness.  Coronary CTA was negative.  Coronary calcium score 0.  CTA dissection protocol negative for any evidence of dissection. -Inflammatory markers negative.  EKG consistent with pericarditis.  Story also inconsistent with pericarditis.  No pain with ibuprofen. -No improvement with acid reflux medication.  He is on Protonix. -He does have some exertional shortness of breath with tightness.  We did start him on albuterol empirically.  He had noticed some improvement with shortness of breath but still having the chest tightness. -We will proceed with PFTs.  He does have asthma we will stop there.  We may consider colchicine to add in case this is possibly some form of pericarditis.  All the data does not point to this. -We will also proceed with an echocardiogram to look for any pericardial effusion.  This will help [place] moving forward.  This may point more towards pericarditis. -He does have residual thymic tissue noted as well.  Referral to thoracic surgery below.  3. Accessory thymic tissue -Residual thymic tissue noted on CT scan.  Unclear if this is the cause of his chest pain.  We will  refer him to thoracic surgery for possible biopsy.  He has no symptoms of lymphoma.  He has no symptoms of any possible malignancy that I can tell.  Disposition: Return in about 1 month (around 09/24/2020).  Medication Adjustments/Labs and Tests Ordered: Current medicines are reviewed at length with the patient today.  Concerns regarding medicines are outlined above.  Orders Placed This Encounter  Procedures   Ambulatory referral to Cardiothoracic Surgery   ECHOCARDIOGRAM COMPLETE   No orders of the defined types were placed in this encounter.   Patient Instructions  Medication Instructions:  No changes *If you need a refill on your cardiac medications before your next appointment, please call your pharmacy*   Lab Work: None ordered If you have labs (blood work) drawn today and your tests are completely normal, you will receive your results only by:  MyChart Message (if you have MyChart) OR  A paper copy in the mail If you have any lab test that is abnormal or we need to change your treatment, we will call you to review the results.   Testing/Procedures: Your physician has requested that you have an echocardiogram. Echocardiography is a painless test that uses sound waves to create images of your heart. It provides your doctor with information about the size and shape of your heart and how well your hearts chambers and valves are working. This procedure takes approximately one hour. There are no restrictions for this procedure. This test is completed at [address].   Follow-Up: At [facility], you and your health needs are our priority.  As part of our continuing mission to provide you with exceptional heart care, we have created designated Provider Care Teams.  These Care Teams include your primary Cardiologist (physician) and Advanced Practice Providers (APPs -  Physician Assistants and Nurse Practitioners) who all work together to provide you with the care you need,  when you need it.  We recommend signing up for the patient portal called "MyChart".  Sign up information is provided on this After Visit Summary.  MyChart is used to  connect with patients for Virtual Visits (Telemedicine).  Patients are able to view lab/test results, encounter notes, upcoming appointments, etc.  Non-urgent messages can be sent to your provider as well.   To learn more about what you can do with MyChart, go to [email or web address].    Your next appointment:   1 month(s)  The format for your next appointment:   In Person  Provider:   Lennie Odor, MD   Other Instructions You have been referred to [facility]. You will be contacted by their office with more information.     Time Spent with Patient: I have spent a total of 35 minutes with patient reviewing hospital notes, telemetry, EKGs, labs and examining the patient as well as establishing an assessment and plan that was discussed with the patient.  > 50% of time was spent in direct patient care.  Signed, Lenna Gilford. Flora Lipps, MD [facility]  [address], [address] [place], [place] [address] [phone]  08/24/2020 9:45 AM

## 2020-08-24 ENCOUNTER — Other Ambulatory Visit: Payer: Self-pay

## 2020-08-24 ENCOUNTER — Ambulatory Visit: Payer: BC Managed Care – PPO | Admitting: Cardiovascular Disease

## 2020-08-24 ENCOUNTER — Encounter: Payer: Self-pay | Admitting: Cardiovascular Disease

## 2020-08-24 VITALS — BP 130/84 | HR 84 | Ht 71.0 in | Wt 197.2 lb

## 2020-08-24 DIAGNOSIS — R0602 Shortness of breath: Secondary | ICD-10-CM | POA: Diagnosis not present

## 2020-08-24 DIAGNOSIS — Q892 Congenital malformations of other endocrine glands: Secondary | ICD-10-CM

## 2020-08-24 DIAGNOSIS — R079 Chest pain, unspecified: Secondary | ICD-10-CM

## 2020-08-24 NOTE — Patient Instructions (Addendum)
Medication Instructions:  No changes *If you need a refill on your cardiac medications before your next appointment, please call your pharmacy*   Lab Work: None ordered If you have labs (blood work) drawn today and your tests are completely normal, you will receive your results only by: Marland Kitchen MyChart Message (if you have MyChart) OR . A paper copy in the mail If you have any lab test that is abnormal or we need to change your treatment, we will call you to review the results.   Testing/Procedures: Your physician has requested that you have an echocardiogram. Echocardiography is a painless test that uses sound waves to create images of your heart. It provides your doctor with information about the size and shape of your heart and how well your heart's chambers and valves are working. This procedure takes approximately one hour. There are no restrictions for this procedure. This test is completed at [address].   Follow-Up: At [facility], you and your health needs are our priority.  As part of our continuing mission to provide you with exceptional heart care, we have created designated Provider Care Teams.  These Care Teams include your primary Cardiologist (physician) and Advanced Practice Providers (APPs -  Physician Assistants and Nurse Practitioners) who all work together to provide you with the care you need, when you need it.  We recommend signing up for the patient portal called "MyChart".  Sign up information is provided on this After Visit Summary.  MyChart is used to connect with patients for Virtual Visits (Telemedicine).  Patients are able to view lab/test results, encounter notes, upcoming appointments, etc.  Non-urgent messages can be sent to your provider as well.   To learn more about what you can do with MyChart, go to [email or web address].    Your next appointment:   1 month(s)  The format for your next appointment:   In Person  Provider:   Lennie Odor,  MD   Other Instructions You have been referred to [facility]. You will be contacted by their office with more information.

## 2020-08-27 ENCOUNTER — Other Ambulatory Visit (HOSPITAL_COMMUNITY)
Admission: RE | Admit: 2020-08-27 | Discharge: 2020-08-27 | Disposition: A | Discharge status: Home / Self Care | Payer: BC Managed Care – PPO | Source: Ambulatory Visit | Attending: Orthopaedic Surgery | Admitting: Orthopaedic Surgery

## 2020-08-27 DIAGNOSIS — Z01812 Encounter for preprocedural laboratory examination: Secondary | ICD-10-CM | POA: Insufficient documentation

## 2020-08-27 DIAGNOSIS — Z20822 Contact with and (suspected) exposure to covid-19: Secondary | ICD-10-CM | POA: Insufficient documentation

## 2020-08-27 LAB — SARS CORONAVIRUS 2 (TAT 6-24 HRS): SARS Coronavirus 2: NEGATIVE

## 2020-08-31 ENCOUNTER — Other Ambulatory Visit: Payer: Self-pay

## 2020-08-31 ENCOUNTER — Ambulatory Visit (HOSPITAL_COMMUNITY)
Admission: RE | Admit: 2020-08-31 | Discharge: 2020-08-31 | Disposition: A | Discharge status: Home / Self Care | Payer: BC Managed Care – PPO | Source: Ambulatory Visit | Attending: Cardiovascular Disease | Admitting: Cardiovascular Disease

## 2020-08-31 DIAGNOSIS — R0602 Shortness of breath: Secondary | ICD-10-CM | POA: Insufficient documentation

## 2020-08-31 LAB — PULMONARY FUNCTION TEST
DL/VA % pred: 90 %
DL/VA: 4.17 ml/min/mmHg/L
DLCO unc % pred: 81 %
DLCO unc: 26.26 ml/min/mmHg
FEF 25-75 Post: 5.82 L/sec
FEF 25-75 Pre: 5.26 L/sec
FEF2575-%Change-Post: 10 %
FEF2575-%Pred-Post: 143 %
FEF2575-%Pred-Pre: 129 %
FEV1-%Change-Post: 0 %
FEV1-%Pred-Post: 98 %
FEV1-%Pred-Pre: 98 %
FEV1-Post: 4.29 L
FEV1-Pre: 4.29 L
FEV1FVC-%Change-Post: 1 %
FEV1FVC-%Pred-Pre: 107 %
FEV6-%Change-Post: -1 %
FEV6-%Pred-Post: 92 %
FEV6-%Pred-Pre: 93 %
FEV6-Post: 4.93 L
FEV6-Pre: 4.99 L
FEV6FVC-%Pred-Post: 102 %
FEV6FVC-%Pred-Pre: 102 %
FVC-%Change-Post: -1 %
FVC-%Pred-Post: 90 %
FVC-%Pred-Pre: 91 %
FVC-Post: 4.93 L
FVC-Pre: 4.99 L
Post FEV1/FVC ratio: 87 %
Post FEV6/FVC ratio: 100 %
Pre FEV1/FVC ratio: 86 %
Pre FEV6/FVC Ratio: 100 %
RV % pred: 99 %
RV: 1.88 L
TLC % pred: 97 %
TLC: 6.93 L

## 2020-08-31 MED ORDER — ALBUTEROL SULFATE (2.5 MG/3ML) 0.083% IN NEBU
2.5000 mg | INHALATION_SOLUTION | Freq: Once | RESPIRATORY_TRACT | Status: AC
  Administered 2020-08-31: 2.5 mg via RESPIRATORY_TRACT

## 2020-09-01 ENCOUNTER — Other Ambulatory Visit: Payer: Self-pay | Admitting: *Deleted

## 2020-09-01 ENCOUNTER — Encounter: Payer: Self-pay | Admitting: Thoracic Surgery (Cardiothoracic Vascular Surgery)

## 2020-09-01 ENCOUNTER — Institutional Professional Consult (permissible substitution): Payer: BC Managed Care – PPO | Admitting: Thoracic Surgery (Cardiothoracic Vascular Surgery)

## 2020-09-01 ENCOUNTER — Other Ambulatory Visit: Payer: Self-pay

## 2020-09-01 VITALS — BP 140/95 | HR 78 | Resp 20 | Ht 71.0 in | Wt 196.0 lb

## 2020-09-01 DIAGNOSIS — E328 Other diseases of thymus: Secondary | ICD-10-CM

## 2020-09-01 DIAGNOSIS — E0500 Thyrotoxicosis with diffuse goiter without thyrotoxic crisis or storm: Secondary | ICD-10-CM

## 2020-09-01 LAB — TSH+FREE T4: TSH W/REFLEX TO FT4: 1.43 mIU/L (ref 0.40–4.50)

## 2020-09-01 NOTE — Progress Notes (Signed)
Thanks, Brett Canales.   -Wes

## 2020-09-01 NOTE — Progress Notes (Signed)
tsh

## 2020-09-01 NOTE — Progress Notes (Signed)
PCP is Patient, No Pcp Per Referring Provider is O'Neal, Ronnald Ramp, *  Chief Complaint  Patient presents with   Consult    Surgical consult, CTA C/A/P 08/06/20, Coronary CT 08/07/20      HPI: Mr. Haynie is sent for consultation regarding some anterior mediastinal soft tissue CT.  Micheal Malone is a 39 year old man with a history of kidney stones.  He was in his usual state of health until about a month ago.  He presented to the ED on 08/06/2020 with a complaint of chest pressure.  He ruled out for MI.  A CT angiogram of the chest showed no evidence of dissection.  There was a little soft tissue in the anterior mediastinum.  A coronary CT showed no coronary calcification.  He was discharged the following day.  He continues to have tightness in his chest.  He says that this tightness is primarily right over the area of his heart just left of the midline.  It is there pretty much all the time.  Is worse in the morning and evenings.  It is not exertional.  Does not appear to be related to eating.  He did get relief with nitroglycerin while in the hospital.   Past Medical History:  Diagnosis Date   History of kidney stones    Left ureteral stone    Wears glasses     Past Surgical History:  Procedure Laterality Date   CYSTOSCOPY/URETEROSCOPY/HOLMIUM LASER/STENT PLACEMENT Left 02/20/2017   Procedure: CYSTOSCOPY LEFT URETEROSCOPY LEFT RETROGRADE PYELOGRAM [place] LASER LITHO / STONE BASKETRY /STENT PLACEMENT;  Surgeon: Ihor Gully, MD;  Location: [facility] ;  Service: Urology;  Laterality: Left;   NO PAST SURGERIES     VASECTOMY Bilateral 02/20/2017   Procedure: VASECTOMY BILATERAL;  Surgeon: Ihor Gully, MD;  [facility];  Service: Urology;  Laterality: Bilateral;    Family History  Problem Relation Age of Onset   Cerebral aneurysm Father    Heart failure Father     Social History Social History   Tobacco Use   Smoking status:  Current Every Day Smoker    Types: E-cigarettes   Smokeless tobacco: Former Neurosurgeon    Types: Snuff    Quit date: 02/16/2016  Vaping Use   Vaping Use: Every day  Substance Use Topics   Alcohol use: Yes    Comment: occassionally   Drug use: No    Current Outpatient Medications  Medication Sig Dispense Refill   albuterol (VENTOLIN HFA) 108 (90 Base) MCG/ACT inhaler Inhale 2 puffs into the lungs every 6 (six) hours as needed for wheezing or shortness of breath. 8 g 2   pantoprazole (PROTONIX) 40 MG tablet Take 1 tablet (40 mg total) by mouth daily. 30 tablet 0   No current facility-administered medications for this visit.    Allergies  Allergen Reactions   Bee Venom Anaphylaxis   Keflex [Cephalexin] Hives    Review of Systems  Respiratory: Positive for chest tightness and shortness of breath.   All other systems reviewed and are negative.   BP (!) 140/95    Pulse 78    Resp 20    Ht 5\' 11"  (1.803 m)    Wt 196 lb (88.9 kg)    SpO2 98% Comment: RA   BMI 27.34 kg/m  Physical Exam Vitals reviewed.  Constitutional:      General: He is not in acute distress.    Appearance: Normal appearance.  HENT:     Head:  Normocephalic and atraumatic.  Eyes:     General: No scleral icterus.    Extraocular Movements: Extraocular movements intact.  Cardiovascular:     Rate and Rhythm: Normal rate and regular rhythm.     Heart sounds: Normal heart sounds. No murmur heard.  No friction rub. No gallop.   Pulmonary:     Effort: Pulmonary effort is normal. No respiratory distress.     Breath sounds: Normal breath sounds. No wheezing.  Abdominal:     General: There is no distension.     Palpations: Abdomen is soft.  Musculoskeletal:     Cervical back: Neck supple.     Comments: Mild tenderness to palpation along the left sternal border and costal cartilages  Lymphadenopathy:     Cervical: No cervical adenopathy.  Skin:    General: Skin is warm and dry.  Neurological:     General:  No focal deficit present.     Mental Status: He is alert and oriented to person, place, and time.     Cranial Nerves: No cranial nerve deficit.     Motor: No weakness.    Diagnostic Tests: CT ANGIOGRAPHY CHEST, ABDOMEN AND PELVIS  TECHNIQUE: Non-contrast CT of the chest was initially obtained.  Multidetector CT imaging through the chest, abdomen and pelvis was performed using the standard protocol during bolus administration of intravenous contrast. Multiplanar reconstructed images and MIPs were obtained and reviewed to evaluate the vascular anatomy.  CONTRAST:  OMNIPAQUE IOHEXOL 350 MG/ML SOLN  COMPARISON:  CT abdomen pelvis 10/26/2016  FINDINGS: CTA CHEST FINDINGS  Cardiovascular: Noncontrast CT of the chest was performed initially. Demonstrates a normal caliber aorta without abnormal hyperdense mural thickening to suggest intramural hematoma. Postcontrast administration, there is satisfactory opacification of the thoracic aorta. The aortic root and portion of the ascending aorta may be suboptimally assessed given cardiac pulsation artifact. No acute luminal abnormality of the imaged aorta. No periaortic stranding or hemorrhage. Normal 3 vessel branching of the aortic arch. Proximal great vessels are unremarkable. Normal heart size. No pericardial effusion. Satisfactory opacification the pulmonary arteries to the segmental level. No pulmonary artery filling defects are identified. Central pulmonary arteries are normal caliber.  Mediastinum/Nodes: Wedge-shaped region of fatty stippling in the anterior mediastinum may reflect remnant thymic tissue. No mediastinal fluid or gas. Normal thyroid gland and thoracic inlet. No acute abnormality of the trachea or esophagus. No worrisome mediastinal, hilar or axillary adenopathy.  Lungs/Pleura: Minimal atelectasis posteriorly. No consolidation, features of edema, pneumothorax, or effusion. No suspicious pulmonary  nodules or masses.  Musculoskeletal: No chest wall abnormality. No acute or significant osseous findings.  Review of the MIP images confirms the above findings.  CTA ABDOMEN AND PELVIS FINDINGS  VASCULAR  Aorta: Normal caliber aorta without aneurysm, dissection, vasculitis or significant stenosis.  Celiac: Patent without evidence of aneurysm, dissection, vasculitis or significant stenosis.  SMA: Patent without evidence of aneurysm, dissection, vasculitis or significant stenosis.  Renals: Single right and paired left renal arteries. The renal arteries are patent without evidence of aneurysm, dissection, vasculitis, fibromuscular dysplasia or significant stenosis.  IMA: Motion artifact near the ostium. No convincing stenosis, evidence of aneurysm, dissection or vasculitis.  Inflow: Minimal plaque in the right internal iliac artery just proximal to the branching of the anterior and posterior divisions without significant stenosis or occlusion. No acute luminal abnormality. No aneurysm or ectasia. No CT features of vasculitis. Proximal outflow vasculature including the common, superficial and deep femoral arteries are unremarkable.  Veins: No obvious venous  abnormality within the limitations of this arterial phase study.  Review of the MIP images confirms the above findings.  NON-VASCULAR  Hepatobiliary: No worrisome focal liver lesions. Smooth liver surface contour. Normal hepatic attenuation. Normal gallbladder and biliary tree.  Pancreas: Unremarkable. No pancreatic ductal dilatation or surrounding inflammatory changes.  Spleen: 'Psychadelic' enhancement pattern of the spleen is typical for the arterial phase of imaging. Normal splenic size. No concerning focal splenic lesion.  Adrenals/Urinary Tract: Normal adrenal glands. Kidneys enhance symmetrically and uniformly. No concerning renal mass, urolithiasis or hydronephrosis. Urinary bladder is  unremarkable.  Stomach/Bowel: Distal esophagus, stomach and duodenal sweep are unremarkable. No small bowel wall thickening or dilatation. No evidence of obstruction. A normal appendix is visualized. No colonic dilatation or wall thickening.  Lymphatic: No suspicious or enlarged lymph nodes in the included lymphatic chains.  Reproductive: The prostate and seminal vesicles are unremarkable.  Other: No abdominopelvic free fluid or free gas. No bowel containing hernias. Tiny fat containing right inguinal and umbilical hernias are present.  Musculoskeletal: No acute or significant osseous findings. Musculature appears normal and symmetric.  Review of the MIP images confirms the above findings.  IMPRESSION: 1. No evidence of acute aortic syndrome. 2. No acute intrathoracic or intra-abdominal process. 3. Wedge-shaped region of fatty stippling in the anterior mediastinum, likely thymic remnant.   Electronically Signed   By: Kreg Shropshire M.D.   On: 08/06/2020 23:46 I personally reviewed the CT images and concur with the findings noted above  Impression: Micheal Malone is a 39 year old otherwise healthy male who presented about a month ago with chest pain.  This was nonexertional in nature.  It did respond to aspirin and sublingual nitroglycerin.  He ruled out for MI.  There was no evidence of an aortic dissection or coronary calcification.  Since then he has had persistent tightness in the left anterior chest region.  This tightness is not exertional.  He does not really relate it to changes in position or eating or any other activity.  He does have some discomfort with palpation in that area but does not precisely replicate the pain.  Part of his work-up he had a CT of the chest to rule out a dissection.  There was a tiny amount of soft tissue in the anterior mediastinum.  It is likely some residual thymic tissue.  It is not really a pathologic finding.  There is no mass.   There is no reason to suspect this has anything to do with his pain.  Possible there is some little component of thymic hyperplasia.  I am going to check a TSH and free T4 level on him to make sure he does not have any hyperthyroidism.  He does really have any other symptoms to suggest that.  There is no indication for any type of biopsy or surgical intervention.  Just to be on the safe side I recommend that we repeat a CT in about 6 months.  He will follow up with Dr. Flora Lipps to discuss the next step.  Plan: Check thyroid function testing Follow-up with Dr. Flora Lipps Consider referral to gastroenterology Repeat CT in 6 months to follow-up anterior mediastinal findings.  Loreli Slot, MD [facility] [phone]

## 2020-09-07 ENCOUNTER — Other Ambulatory Visit: Payer: Self-pay

## 2020-09-07 ENCOUNTER — Ambulatory Visit (HOSPITAL_COMMUNITY): Payer: BC Managed Care – PPO | Attending: Cardiology

## 2020-09-07 DIAGNOSIS — R079 Chest pain, unspecified: Secondary | ICD-10-CM

## 2020-09-07 LAB — ECHOCARDIOGRAM COMPLETE
Area-P 1/2: 3.85 cm2
S' Lateral: 2.9 cm

## 2020-09-18 NOTE — Progress Notes (Deleted)
Cardiology Office Note:   Date:  09/18/2020  NAME:  Micheal Malone    MRN: [id number] DOB:  12/01/1980   PCP:  Patient, No Pcp Per  Cardiologist:  Reatha Harps, MD  Electrophysiologist:  None   Referring MD: No ref. provider found   No chief complaint on file. ***  History of Present Illness:   Micheal Malone is a 39 y.o. male with a hx of kidney stones, thymic mass who presents for follow-up.  Had extensive evaluation for chest pain.  This included a normal coronary CTA.  He had a negative CTA dissection study.  He had a normal echocardiogram.  Symptoms have not improved with anti-inflammatories or acid reflux medications.  Past Medical History: Past Medical History:  Diagnosis Date   History of kidney stones    Left ureteral stone    Wears glasses     Past Surgical History: Past Surgical History:  Procedure Laterality Date   CYSTOSCOPY/URETEROSCOPY/HOLMIUM LASER/STENT PLACEMENT Left 02/20/2017   Procedure: CYSTOSCOPY LEFT URETEROSCOPY LEFT RETROGRADE PYELOGRAM Manning Charity LASER LITHO / STONE BASKETRY /STENT PLACEMENT;  Surgeon: Ihor Gully, MD;  Location: [facility];  Service: Urology;  Laterality: Left;   NO PAST SURGERIES     VASECTOMY Bilateral 02/20/2017   Procedure: VASECTOMY BILATERAL;  Surgeon: Ihor Gully, MD;  Location: [facility];  Service: Urology;  Laterality: Bilateral;    Current Medications: No outpatient medications have been marked as taking for the 09/21/20 encounter (Appointment) with O'Neal, Ronnald Ramp, MD.     Allergies:    Bee venom and Keflex [cephalexin]   Social History: Social History   Socioeconomic History   Marital status: Single    Spouse name: Not on file   Number of children: Not on file   Years of education: Not on file   Highest education level: Not on file  Occupational History   Not on file  Tobacco Use   Smoking status: Current Every Day Smoker    Types: E-cigarettes    Smokeless tobacco: Former Neurosurgeon    Types: Snuff    Quit date: 02/16/2016  Vaping Use   Vaping Use: Every day  Substance and Sexual Activity   Alcohol use: Yes    Comment: occassionally   Drug use: No   Sexual activity: Not on file  Other Topics Concern   Not on file  Social History Narrative   Not on file   Social Determinants of Health   Financial Resource Strain:    Difficulty of Paying Living Expenses: Not on file  Food Insecurity:    Worried About Programme researcher, broadcasting/film/video in the Last Year: Not on file   [employer] of Food in the Last Year: Not on file  Transportation Needs:    Lack of Transportation (Medical): Not on file   Lack of Transportation (Non-Medical): Not on file  Physical Activity:    Days of Exercise per Week: Not on file   Minutes of Exercise per Session: Not on file  Stress:    Feeling of Stress : Not on file  Social Connections:    Frequency of Communication with Friends and Family: Not on file   Frequency of Social Gatherings with Friends and Family: Not on file   Attends Religious Services: Not on file   Active Member of Clubs or Organizations: Not on file   Attends Banker Meetings: Not on file   Marital Status: Not on file     Family  History: The patient's ***family history includes Cerebral aneurysm in his father; Heart failure in his father.  ROS:   All other ROS reviewed and negative. Pertinent positives noted in the HPI.     EKGs/Labs/Other Studies Reviewed:   The following studies were personally reviewed by me today:  EKG:  EKG is *** ordered today.  The ekg ordered today demonstrates ***, and was personally reviewed by me.   TTE 09/07/2020 1. Left ventricular ejection fraction by 3D volume is 60 %. The left  ventricle has normal function. The left ventricle has no regional wall  motion abnormalities. Left ventricular diastolic parameters were normal.  The average left ventricular global  longitudinal strain is  -23.3 %. The global longitudinal strain is normal.  2. Right ventricular systolic function is normal. The right ventricular  size is normal. Tricuspid regurgitation signal is inadequate for assessing  PA pressure.  3. The mitral valve is normal in structure. Trivial mitral valve  regurgitation. No evidence of mitral stenosis.  4. The aortic valve is tricuspid. Aortic valve regurgitation is not  visualized. No aortic stenosis is present.  5. The inferior vena cava is normal in size with greater than 50%  respiratory variability, suggesting right atrial pressure of 3 mmHg.   TTE 08/07/2020 IMPRESSION: 1. Coronary calcium score of 0. 2. Normal coronary origin with right dominance. 3. Normal coronary arteries. 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 RECOMMENDATIONS: 1. No evidence of CAD (0%). Consider non-atherosclerotic causes of chest pain.  Recent Labs: 08/06/2020: BUN 10; Creatinine, Ser 0.97; Hemoglobin 15.5; Platelets 277; Potassium 3.6; Sodium 140   Recent Lipid Panel    Component Value Date/Time   CHOL 227 (H) 08/06/2020 1928   TRIG 164 (H) 08/06/2020 1928   HDL 46 08/06/2020 1928   CHOLHDL 4.9 08/06/2020 1928   VLDL 33 08/06/2020 1928   LDLCALC 148 (H) 08/06/2020 1928    Physical Exam:   VS:  There were no vitals taken for this visit.   Wt Readings from Last 3 Encounters:  09/01/20 196 lb (88.9 kg)  08/24/20 197 lb 3.2 oz (89.4 kg)  08/07/20 201 lb 8 oz (91.4 kg)    General: Well nourished, well developed, in no acute distress Heart: Atraumatic, normal size  Eyes: PEERLA, EOMI  Neck: Supple, no JVD Endocrine: No thryomegaly Cardiac: Normal S1, S2; RRR; no murmurs, rubs, or gallops Lungs: Clear to auscultation bilaterally, no wheezing, rhonchi or rales  Abd: Soft, nontender, no hepatomegaly  Ext: No edema, pulses 2+ Musculoskeletal: No deformities, BUE and BLE strength normal and equal Skin: Warm and dry, no rashes   Neuro: Alert and oriented to  person, place, time, and situation, CNII-XII grossly intact, no focal deficits  Psych: Normal mood and affect   ASSESSMENT:   Micheal Malone is a 39 y.o. male who presents for the following: No diagnosis found.  PLAN:   There are no diagnoses linked to this encounter.  Disposition: No follow-ups on file.  Medication Adjustments/Labs and Tests Ordered: Current medicines are reviewed at length with the patient today.  Concerns regarding medicines are outlined above.  No orders of the defined types were placed in this encounter.  No orders of the defined types were placed in this encounter.   There are no Patient Instructions on file for this visit.   Time Spent with Patient: I have spent a total of *** minutes with patient reviewing hospital notes, telemetry, EKGs, labs and examining the patient as well as establishing  an assessment and plan that was discussed with the patient.  > 50% of time was spent in direct patient care.  Signed, Lenna Gilford. Flora Lipps, MD [facility]  [address] [place], [place] [address] [phone]  09/18/2020 6:47 PM

## 2020-09-21 ENCOUNTER — Ambulatory Visit: Payer: BC Managed Care – PPO | Admitting: Cardiovascular Disease

## 2020-11-19 IMAGING — CR DG CHEST 2V
2 series · 2 of 2 positions shown · non-contrast
Comparison: Radiograph 10/19/2019

CLINICAL DATA: Chest pain for 3 days, shortness of breath,
nonsmoker

EXAM:
CHEST - 2 VIEW

[chest pa]
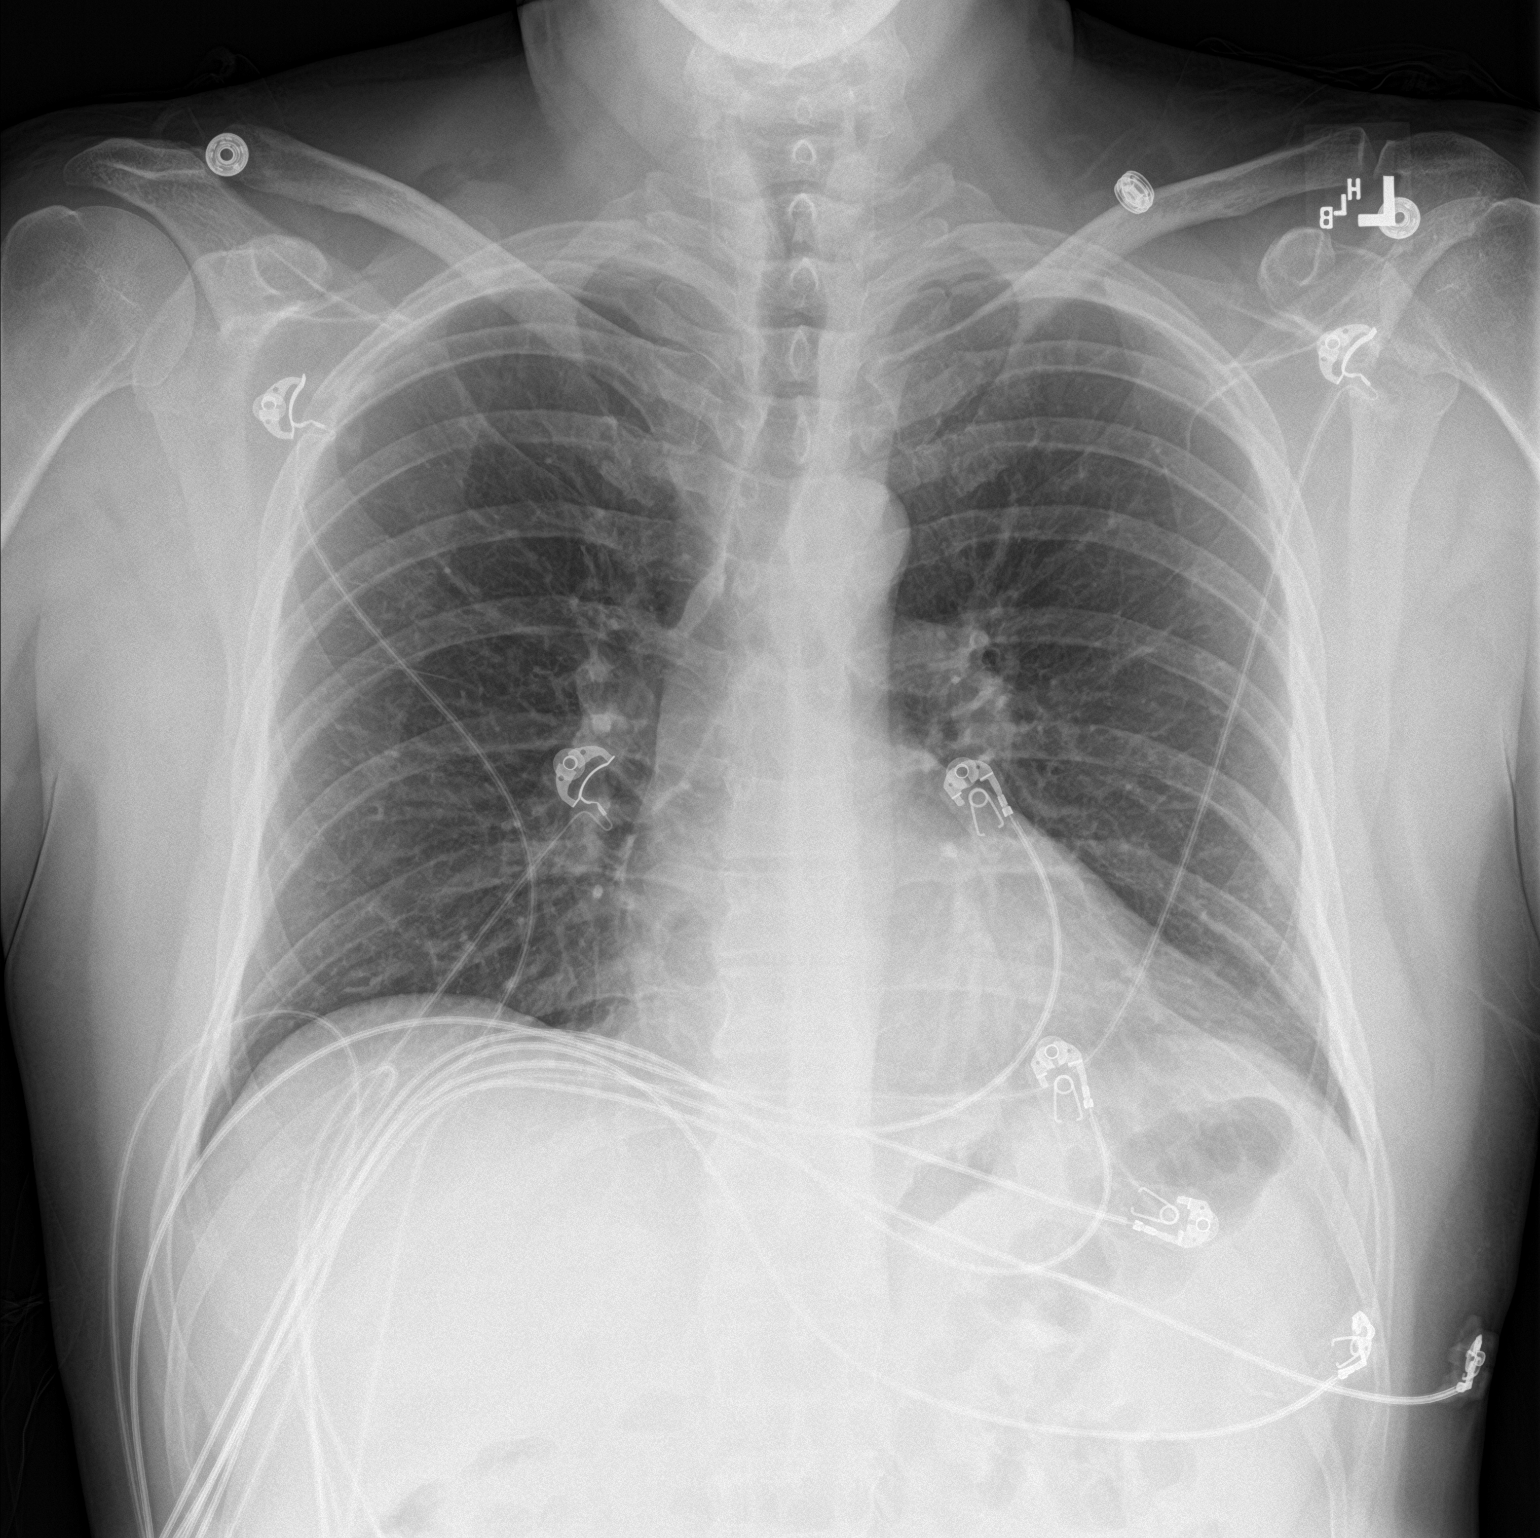

[chest lat]
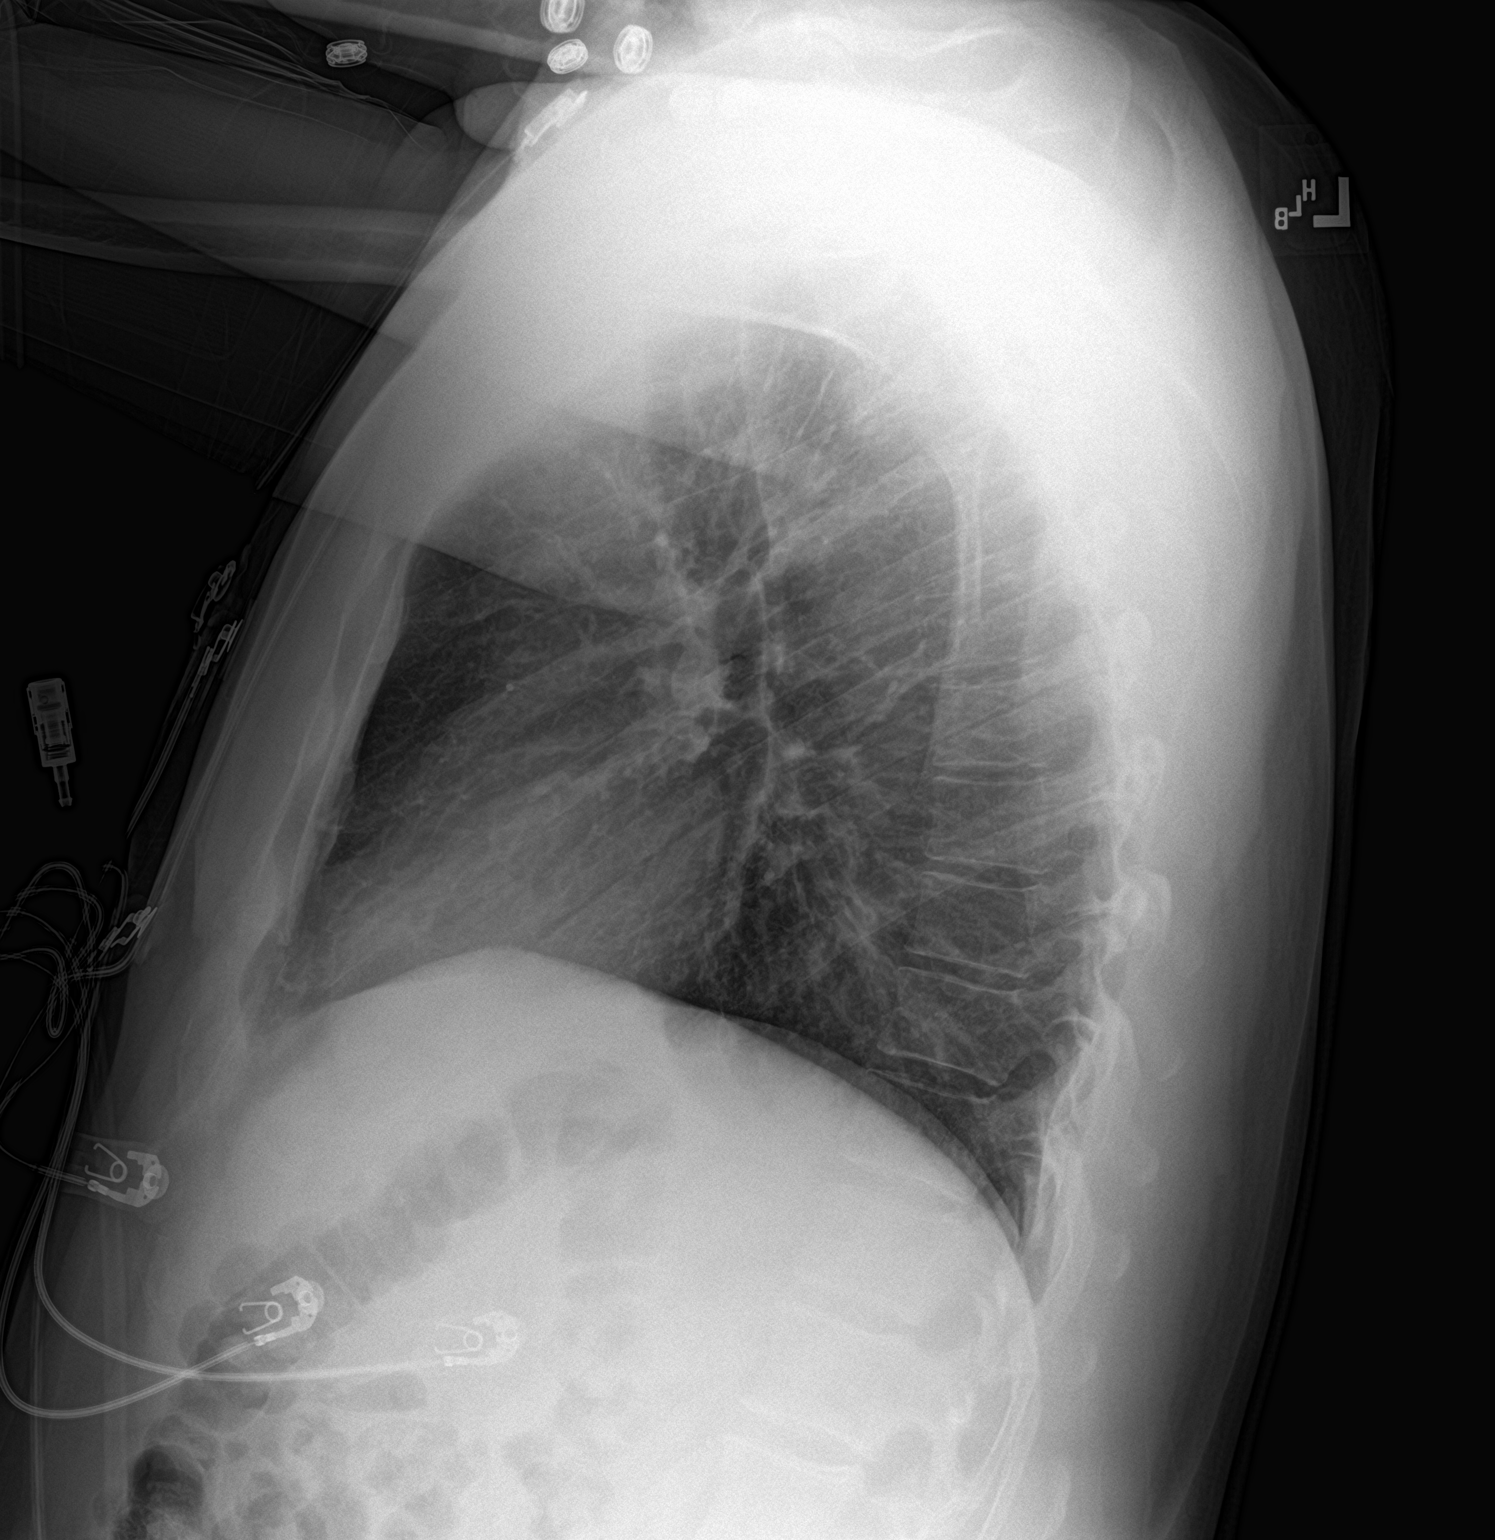

[2 of 2 positions shown; findings below may reference images not displayed]

FINDINGS: No consolidation, features of edema, pneumothorax, or effusion.
Pulmonary vascularity is normally distributed. The cardiomediastinal
contours are unremarkable. No acute osseous or soft tissue
abnormality. Telemetry leads overlie the chest.
IMPRESSION: No acute cardiopulmonary abnormality.

## 2021-01-21 ENCOUNTER — Other Ambulatory Visit: Payer: Self-pay | Admitting: Thoracic Surgery (Cardiothoracic Vascular Surgery)

## 2021-01-21 DIAGNOSIS — E320 Persistent hyperplasia of thymus: Secondary | ICD-10-CM

## 2021-01-21 DIAGNOSIS — R0782 Intercostal pain: Secondary | ICD-10-CM

## 2021-02-16 ENCOUNTER — Other Ambulatory Visit: Payer: BC Managed Care – PPO

## 2021-02-23 ENCOUNTER — Ambulatory Visit: Payer: BC Managed Care – PPO | Admitting: Thoracic Surgery (Cardiothoracic Vascular Surgery)

## 2022-06-01 ENCOUNTER — Encounter (HOSPITAL_COMMUNITY): Payer: Self-pay

## 2022-06-01 ENCOUNTER — Emergency Department (HOSPITAL_COMMUNITY)
Admission: EM | Admit: 2022-06-01 | Discharge: 2022-06-01 | Disposition: A | Discharge status: Home / Self Care | Payer: No Typology Code available for payment source | Attending: Emergency Medicine | Admitting: Emergency Medicine

## 2022-06-01 ENCOUNTER — Emergency Department (HOSPITAL_COMMUNITY): Payer: No Typology Code available for payment source

## 2022-06-01 DIAGNOSIS — S99922A Unspecified injury of left foot, initial encounter: Secondary | ICD-10-CM | POA: Diagnosis present

## 2022-06-01 DIAGNOSIS — Y30XXXA Falling, jumping or pushed from a high place, undetermined intent, initial encounter: Secondary | ICD-10-CM | POA: Diagnosis not present

## 2022-06-01 DIAGNOSIS — S92215A Nondisplaced fracture of cuboid bone of left foot, initial encounter for closed fracture: Secondary | ICD-10-CM | POA: Insufficient documentation

## 2022-06-01 DIAGNOSIS — S92302A Fracture of unspecified metatarsal bone(s), left foot, initial encounter for closed fracture: Secondary | ICD-10-CM

## 2022-06-01 DIAGNOSIS — M79672 Pain in left foot: Secondary | ICD-10-CM | POA: Insufficient documentation

## 2022-06-01 DIAGNOSIS — S92322A Displaced fracture of second metatarsal bone, left foot, initial encounter for closed fracture: Secondary | ICD-10-CM | POA: Diagnosis not present

## 2022-06-01 DIAGNOSIS — S92342A Displaced fracture of fourth metatarsal bone, left foot, initial encounter for closed fracture: Secondary | ICD-10-CM | POA: Diagnosis not present

## 2022-06-01 DIAGNOSIS — S92332A Displaced fracture of third metatarsal bone, left foot, initial encounter for closed fracture: Secondary | ICD-10-CM | POA: Insufficient documentation

## 2022-06-01 DIAGNOSIS — Y9339 Activity, other involving climbing, rappelling and jumping off: Secondary | ICD-10-CM | POA: Diagnosis not present

## 2022-06-01 MED ORDER — OXYCODONE-ACETAMINOPHEN 5-325 MG PO TABS: 1.0000 | ORAL_TABLET | ORAL | 0 refills | Status: DC | PRN

## 2022-06-01 MED ORDER — OXYCODONE-ACETAMINOPHEN 5-325 MG PO TABS
1.0000 | ORAL_TABLET | Freq: Once | ORAL | Status: AC
  Administered 2022-06-01: 1 via ORAL
  Filled 2022-06-01: qty 1

## 2022-06-01 NOTE — ED Provider Notes (Signed)
[facility] EMERGENCY DEPARTMENT Provider Note   CSN: [id number] Arrival date & time: 06/01/22  1247     History  Chief Complaint  Patient presents with   Foot Injury    Micheal Malone is a 41 y.o. male.  Pt jumped off of a truck and he fell on his toes   The history is provided by the patient. No language interpreter was used.  Foot Injury Location:  Foot Time since incident:  6 hours Injury: no   Foot location:  L foot Pain details:    Radiates to:  Does not radiate   Severity:  Moderate   Onset quality:  Sudden   Duration:  6 hours   Timing:  Constant Chronicity:  New Dislocation: no   Relieved by:  Nothing Ineffective treatments:  None tried Associated symptoms: no back pain   Risk factors: no concern for non-accidental trauma        Home Medications Prior to Admission medications   Medication Sig Start Date End Date Taking? Authorizing Provider  albuterol (VENTOLIN HFA) 108 (90 Base) MCG/ACT inhaler Inhale 2 puffs into the lungs every 6 (six) hours as needed for wheezing or shortness of breath. 08/18/20   O'NealCassie Freer, MD  pantoprazole (PROTONIX) 40 MG tablet Take 1 tablet (40 mg total) by mouth daily. 08/07/20 08/07/21  Leanor Kail, PA      Allergies    Bee venom and Keflex [cephalexin]    Review of Systems   Review of Systems  Musculoskeletal:  Negative for back pain.  All other systems reviewed and are negative.   Physical Exam Updated Vital Signs BP (!) 139/105   Pulse 69   Temp 98.2 F (36.8 C) (Oral)   Resp 19   Ht 5\' 11"  (1.803 m)   Wt 89 kg   SpO2 99%   BMI 27.37 kg/m  Physical Exam Vitals and nursing note reviewed.  Constitutional:      Appearance: He is well-developed.  HENT:     Head: Normocephalic.  Cardiovascular:     Rate and Rhythm: Normal rate.  Pulmonary:     Effort: Pulmonary effort is normal.  Abdominal:     General: There is no distension.  Musculoskeletal:        General:  Swelling and tenderness present.     Cervical back: Normal range of motion.     Comments: Tender left foot,  good pulse, decreased range of motion  nv intact   Skin:    General: Skin is warm.  Neurological:     General: No focal deficit present.     Mental Status: He is alert and oriented to person, place, and time.  Psychiatric:        Mood and Affect: Mood normal.     ED Results / Procedures / Treatments   Labs (all labs ordered are listed, but only abnormal results are displayed) Labs Reviewed - No data to display  EKG None  Radiology DG Foot Complete Left  Result Date: 06/01/2022 CLINICAL DATA:  Pain swelling and limited range of motion. Stepped out of truck today and rolled left ankle and foot on curb. EXAM: LEFT ANKLE COMPLETE - 3+ VIEW; LEFT FOOT - COMPLETE 3+ VIEW COMPARISON:  Left foot radiographs 08/29/2017 FINDINGS: Left ankle: The ankle mortise is symmetric and intact. Joint space is preserved. No acute fracture or dislocation. Left foot: New oblique fracture of the mid to distal shaft of the second metatarsal with approximately 7  mm (1 bone width) lateral and approximately 5 mm dorsal displacement of the distal fracture component with respect to the proximal fracture component. Visualization on lateral view is limited by overlapping bones. Acute nondisplaced fracture of the proximal shaft of third metatarsal. Additional oblique fracture of the distal shaft of the third metatarsal with up to 2 mm lateral displacement of the distal fracture component. Acute nondisplaced and mildly comminuted fracture of the proximal metaphysis of the fourth metatarsal extending through the lateral cortex. Curvilinear lucency within the far distal lateral aspect of the cuboid suspicious for a nondisplaced but mildly comminuted distal intra-articular fracture. IMPRESSION: 1. Acute fractures of second, third, and fourth metatarsals with displaced fracture components within the mid shaft of the second  metatarsal and distal shaft of the third metatarsal. 2. Acute nondisplaced fracture of the distal lateral aspect of the cuboid. Electronically Signed   By: Neita Garnet M.D.   On: 06/01/2022 14:06   DG Ankle Complete Left  Result Date: 06/01/2022 CLINICAL DATA:  Pain swelling and limited range of motion. Stepped out of truck today and rolled left ankle and foot on curb. EXAM: LEFT ANKLE COMPLETE - 3+ VIEW; LEFT FOOT - COMPLETE 3+ VIEW COMPARISON:  Left foot radiographs 08/29/2017 FINDINGS: Left ankle: The ankle mortise is symmetric and intact. Joint space is preserved. No acute fracture or dislocation. Left foot: New oblique fracture of the mid to distal shaft of the second metatarsal with approximately 7 mm (1 bone width) lateral and approximately 5 mm dorsal displacement of the distal fracture component with respect to the proximal fracture component. Visualization on lateral view is limited by overlapping bones. Acute nondisplaced fracture of the proximal shaft of third metatarsal. Additional oblique fracture of the distal shaft of the third metatarsal with up to 2 mm lateral displacement of the distal fracture component. Acute nondisplaced and mildly comminuted fracture of the proximal metaphysis of the fourth metatarsal extending through the lateral cortex. Curvilinear lucency within the far distal lateral aspect of the cuboid suspicious for a nondisplaced but mildly comminuted distal intra-articular fracture. IMPRESSION: 1. Acute fractures of second, third, and fourth metatarsals with displaced fracture components within the mid shaft of the second metatarsal and distal shaft of the third metatarsal. 2. Acute nondisplaced fracture of the distal lateral aspect of the cuboid. Electronically Signed   By: Neita Garnet M.D.   On: 06/01/2022 14:06    Procedures Procedures    Medications Ordered in ED Medications  oxyCODONE-acetaminophen (PERCOCET/ROXICET) 5-325 MG per tablet 1 tablet (1 tablet Oral  Given 06/01/22 1341)    ED Course/ Medical Decision Making/ A&P                           Medical Decision Making Pt complains of foot pain after injury  Amount and/or Complexity of Data Reviewed Independent Historian: spouse    Details: Pt's wife is here with him External Data Reviewed: notes.    Details: Cardiology notes reviewed.  Pt has had a previous MI Radiology: ordered and independent interpretation performed. Decision-making details documented in ED Course.    Details: Xray shows acute fractures of 2nd, 3rd and fourth metatarsals Discussion of management or test interpretation with external provider(s): I spoke with Dale [place] PA who evaluated pt here.  He advised ct scan,  splint crutches,  Follow up with Dr. Steward Drone for surgical evaluation   Risk Prescription drug management.  Final Clinical Impression(s) / ED Diagnoses Final diagnoses:  Multiple closed fractures of metatarsal bone of left foot, initial encounter    Rx / DC Orders ED Discharge Orders          Ordered    oxyCODONE-acetaminophen (PERCOCET) 5-325 MG tablet  Every 4 hours PRN        06/01/22 1749          An After Visit Summary was printed and given to the patient.     Elson Areas, [place] 06/01/22 1750    Alvira Monday, MD 06/02/22 1719

## 2022-06-01 NOTE — Consult Note (Signed)
Reason for Consult:Left foot fxs Referring Physician: Alvira Monday Time called: 1538 Time at bedside: 1544   Micheal Malone is an 41 y.o. male.  HPI: Micheal Malone jumped out of his truck this morning and had severe pain in his left foot when he landed. He was unable to bear weight afterwards. He was brought to the ED where x-rays showed multiple foot fxs and orthopedic surgery was consulted. He works as a Armed forces training and education officer.  Past Medical History:  Diagnosis Date   History of kidney stones    Left ureteral stone    Wears glasses     Past Surgical History:  Procedure Laterality Date   CYSTOSCOPY/URETEROSCOPY/HOLMIUM LASER/STENT PLACEMENT Left 02/20/2017   Procedure: CYSTOSCOPY LEFT URETEROSCOPY LEFT RETROGRADE PYELOGRAM Manning Charity LASER LITHO / STONE BASKETRY /STENT PLACEMENT;  Surgeon: Ihor Gully, MD;  Location: [facility];  Service: Urology;  Laterality: Left;   NO PAST SURGERIES     VASECTOMY Bilateral 02/20/2017   Procedure: VASECTOMY BILATERAL;  Surgeon: Ihor Gully, MD;  Location: [facility];  Service: Urology;  Laterality: Bilateral;    Family History  Problem Relation Age of Onset   Cerebral aneurysm Father    Heart failure Father     Social History:  reports that he has been smoking e-cigarettes. He quit smokeless tobacco use about 6 years ago.  His smokeless tobacco use included snuff. He reports current alcohol use. He reports that he does not use drugs.  Allergies:  Allergies  Allergen Reactions   Bee Venom Anaphylaxis   Keflex [Cephalexin] Hives    Medications: I have reviewed the patient's current medications.  No results found for this or any previous visit (from the past 48 hour(s)).  DG Foot Complete Left  Result Date: 06/01/2022 CLINICAL DATA:  Pain swelling and limited range of motion. Stepped out of truck today and rolled left ankle and foot on curb. EXAM: LEFT ANKLE COMPLETE - 3+ VIEW; LEFT FOOT - COMPLETE 3+ VIEW COMPARISON:   Left foot radiographs 08/29/2017 FINDINGS: Left ankle: The ankle mortise is symmetric and intact. Joint space is preserved. No acute fracture or dislocation. Left foot: New oblique fracture of the mid to distal shaft of the second metatarsal with approximately 7 mm (1 bone width) lateral and approximately 5 mm dorsal displacement of the distal fracture component with respect to the proximal fracture component. Visualization on lateral view is limited by overlapping bones. Acute nondisplaced fracture of the proximal shaft of third metatarsal. Additional oblique fracture of the distal shaft of the third metatarsal with up to 2 mm lateral displacement of the distal fracture component. Acute nondisplaced and mildly comminuted fracture of the proximal metaphysis of the fourth metatarsal extending through the lateral cortex. Curvilinear lucency within the far distal lateral aspect of the cuboid suspicious for a nondisplaced but mildly comminuted distal intra-articular fracture. IMPRESSION: 1. Acute fractures of second, third, and fourth metatarsals with displaced fracture components within the mid shaft of the second metatarsal and distal shaft of the third metatarsal. 2. Acute nondisplaced fracture of the distal lateral aspect of the cuboid. Electronically Signed   By: Neita Garnet M.D.   On: 06/01/2022 14:06   DG Ankle Complete Left  Result Date: 06/01/2022 CLINICAL DATA:  Pain swelling and limited range of motion. Stepped out of truck today and rolled left ankle and foot on curb. EXAM: LEFT ANKLE COMPLETE - 3+ VIEW; LEFT FOOT - COMPLETE 3+ VIEW COMPARISON:  Left foot radiographs 08/29/2017 FINDINGS: Left ankle: The  ankle mortise is symmetric and intact. Joint space is preserved. No acute fracture or dislocation. Left foot: New oblique fracture of the mid to distal shaft of the second metatarsal with approximately 7 mm (1 bone width) lateral and approximately 5 mm dorsal displacement of the distal fracture  component with respect to the proximal fracture component. Visualization on lateral view is limited by overlapping bones. Acute nondisplaced fracture of the proximal shaft of third metatarsal. Additional oblique fracture of the distal shaft of the third metatarsal with up to 2 mm lateral displacement of the distal fracture component. Acute nondisplaced and mildly comminuted fracture of the proximal metaphysis of the fourth metatarsal extending through the lateral cortex. Curvilinear lucency within the far distal lateral aspect of the cuboid suspicious for a nondisplaced but mildly comminuted distal intra-articular fracture. IMPRESSION: 1. Acute fractures of second, third, and fourth metatarsals with displaced fracture components within the mid shaft of the second metatarsal and distal shaft of the third metatarsal. 2. Acute nondisplaced fracture of the distal lateral aspect of the cuboid. Electronically Signed   By: Neita Garnet M.D.   On: 06/01/2022 14:06    Review of Systems  HENT:  Negative for ear discharge, ear pain, hearing loss and tinnitus.   Eyes:  Negative for photophobia and pain.  Respiratory:  Negative for cough and shortness of breath.   Cardiovascular:  Negative for chest pain.  Gastrointestinal:  Negative for abdominal pain, nausea and vomiting.  Genitourinary:  Negative for dysuria, flank pain, frequency and urgency.  Musculoskeletal:  Positive for arthralgias (Left foot). Negative for back pain, myalgias and neck pain.  Neurological:  Negative for dizziness and headaches.  Hematological:  Does not bruise/bleed easily.  Psychiatric/Behavioral:  The patient is not nervous/anxious.    Blood pressure (!) 147/104, pulse 72, temperature 98.2 F (36.8 C), temperature source Oral, resp. rate 19, height 5\' 11"  (1.803 m), weight 89 kg, SpO2 100 %. Physical Exam Constitutional:      General: He is not in acute distress.    Appearance: He is well-developed. He is not diaphoretic.  HENT:      Head: Normocephalic and atraumatic.  Eyes:     General: No scleral icterus.       Right eye: No discharge.        Left eye: No discharge.     Conjunctiva/sclera: Conjunctivae normal.  Cardiovascular:     Rate and Rhythm: Normal rate and regular rhythm.  Pulmonary:     Effort: Pulmonary effort is normal. No respiratory distress.  Musculoskeletal:     Cervical back: Normal range of motion.     Comments: LLE No traumatic wounds, ecchymosis, or rash  Severe TTP forefoot, mod edema and early ecchymosis  No knee effusion  Knee stable to varus/ valgus and anterior/posterior stress  Sens DPN, SPN, TN intact  Motor EHL, ext, flex, evers 5/5  DP 0 (dopplerable), PT 2+, No significant edema  Skin:    General: Skin is warm and dry.  Neurological:     Mental Status: He is alert.  Psychiatric:        Mood and Affect: Mood normal.        Behavior: Behavior normal.     Assessment/Plan: Left foot fxs -- Will get CT to better delineate tarsal involvement. Short leg splint and crutches. F/u with Dr. next week.    Steward Drone, PA-C Orthopedic Surgery [phone] 06/01/2022, 3:50 PM

## 2022-06-01 NOTE — Progress Notes (Signed)
Orthopedic Tech Progress Note Patient Details:  [name] 1981/09/07 [id number]  Ortho Devices Type of Ortho Device: Crutches, Short leg splint Ortho Device/Splint Location: LLE Ortho Device/Splint Interventions: Application   Post Interventions Patient Tolerated: Well  Micheal Malone 06/01/2022, 4:15 PM

## 2022-06-01 NOTE — Discharge Instructions (Addendum)
Return if any problems.

## 2022-06-01 NOTE — ED Notes (Signed)
This RN emphasized the importance of following up with orthopedic surgeon.  Discharge paperwork included information of orthopedic surgeon along with contact information.  Pt and spouse verbalized understanding.

## 2022-06-01 NOTE — ED Triage Notes (Signed)
Pt arrives POV for eval of L sided ankle/foot pain. Reports he jumped out of his truck and rolled his L ankle and then developed immediate pain. Reports numbness to L toes, pain all the way up calf. Unable to move toes in triage, foot is warm <2 cap refill, pulses by doppler to L foot

## 2022-06-01 NOTE — ED Provider Triage Note (Signed)
Emergency Medicine Provider Triage Evaluation Note  ELDREDGE VELDHUIZEN , a 41 y.o. male  was evaluated in triage.  Pt complains of left foot injury.  He states that earlier today he jumped out of his vehicle and landed wrong on his left foot and felt a popping sensation in his foot followed by severe pain.  He states that he has been unable to walk since then due to pain.  He is also having some diminished sensation in his toes and inability to wiggle his toes.  Review of Systems  Positive:  Negative:   Physical Exam  BP (!) 140/106   Pulse 78   Temp 98.7 F (37.1 C) (Oral)   Ht 5\' 11"  (1.803 m)   Wt 89 kg   SpO2 98%   BMI 27.37 kg/m  Gen:   Awake, no distress some obvious discomfort Resp:  Normal effort  MSK:   Deformity noted to the dorsal aspect of the left foot.  Capillary refill 2 to 3 seconds.  DP and PT pulses detected with Doppler.  ROM severely limited Other:    Medical Decision Making  Medically screening exam initiated at 1:49 PM.  Appropriate orders placed.  MCCALL LOMAX was informed that the remainder of the evaluation will be completed by another provider, this initial triage assessment does not replace that evaluation, and the importance of remaining in the ED until their evaluation is complete.     Joellyn Quails, PA-C 06/01/22 1351

## 2022-06-02 ENCOUNTER — Encounter (HOSPITAL_COMMUNITY): Payer: Self-pay | Admitting: Orthopedic Surgery

## 2022-06-02 ENCOUNTER — Other Ambulatory Visit: Payer: Self-pay

## 2022-06-02 ENCOUNTER — Encounter: Payer: Self-pay | Admitting: Orthopedic Surgery

## 2022-06-02 ENCOUNTER — Ambulatory Visit (INDEPENDENT_AMBULATORY_CARE_PROVIDER_SITE_OTHER): Payer: No Typology Code available for payment source | Admitting: Orthopedic Surgery

## 2022-06-02 ENCOUNTER — Telehealth: Payer: Self-pay | Admitting: Orthopaedic Surgery

## 2022-06-02 DIAGNOSIS — S93325A Dislocation of tarsometatarsal joint of left foot, initial encounter: Secondary | ICD-10-CM | POA: Diagnosis not present
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
## 2022-06-02 NOTE — Telephone Encounter (Signed)
Pt called requesting a call back. Pt was seen at [facility] for fractured left foot and discharge wants him to see Dr. Steward Drone. Dr. Steward Drone has no openings until 8/9/. Please call pt with appt date and time. Pt phone number is [phone]

## 2022-06-02 NOTE — Telephone Encounter (Signed)
Pt scheduled with Dr. Lajoyce Corners 7/27 after consult with Steward Drone previous night on call

## 2022-06-02 NOTE — Progress Notes (Signed)
Micheal Malone denies chest pain or shortness of breath. Patient denies having any s/s of Covid in his household.  Patient denies any known exposure to Covid.   Micheal Malone does not have a PCP.

## 2022-06-03 ENCOUNTER — Other Ambulatory Visit: Payer: Self-pay

## 2022-06-03 ENCOUNTER — Ambulatory Visit (HOSPITAL_COMMUNITY)
Admission: RE | Admit: 2022-06-03 | Discharge: 2022-06-03 | Disposition: A | Discharge status: Home / Self Care | Payer: No Typology Code available for payment source | Attending: Orthopedic Surgery | Admitting: Orthopedic Surgery

## 2022-06-03 ENCOUNTER — Encounter (HOSPITAL_COMMUNITY): Payer: Self-pay | Admitting: Orthopedic Surgery

## 2022-06-03 ENCOUNTER — Ambulatory Visit (HOSPITAL_COMMUNITY): Payer: No Typology Code available for payment source | Admitting: Anesthesiology

## 2022-06-03 ENCOUNTER — Encounter (HOSPITAL_COMMUNITY)
Admission: RE | Disposition: A | Discharge status: Home / Self Care | Payer: Self-pay | Source: Home / Self Care | Attending: Orthopedic Surgery

## 2022-06-03 ENCOUNTER — Ambulatory Visit (HOSPITAL_COMMUNITY): Payer: No Typology Code available for payment source

## 2022-06-03 ENCOUNTER — Ambulatory Visit (HOSPITAL_BASED_OUTPATIENT_CLINIC_OR_DEPARTMENT_OTHER): Payer: No Typology Code available for payment source | Admitting: Anesthesiology

## 2022-06-03 DIAGNOSIS — S93335A Other dislocation of left foot, initial encounter: Secondary | ICD-10-CM | POA: Diagnosis present

## 2022-06-03 DIAGNOSIS — X58XXXA Exposure to other specified factors, initial encounter: Secondary | ICD-10-CM | POA: Insufficient documentation

## 2022-06-03 DIAGNOSIS — S93325A Dislocation of tarsometatarsal joint of left foot, initial encounter: Secondary | ICD-10-CM | POA: Diagnosis not present

## 2022-06-03 HISTORY — PX: FOOT ARTHRODESIS: SHX1655

## 2022-06-03 LAB — CBC
HCT: 45 % (ref 39.0–52.0)
Hemoglobin: 15.7 g/dL (ref 13.0–17.0)
MCH: 30.5 pg (ref 26.0–34.0)
MCHC: 34.9 g/dL (ref 30.0–36.0)
MCV: 87.5 fL (ref 80.0–100.0)
Platelets: 248 10*3/uL (ref 150–400)
RBC: 5.14 MIL/uL (ref 4.22–5.81)
RDW: 12 % (ref 11.5–15.5)
WBC: 7.1 10*3/uL (ref 4.0–10.5)
nRBC: 0 % (ref 0.0–0.2)

## 2022-06-03 SURGERY — FUSION, JOINT, FOOT
Anesthesia: Monitor Anesthesia Care | Site: Foot | Laterality: Left

## 2022-06-03 MED ORDER — PROPOFOL 10 MG/ML IV BOLUS
INTRAVENOUS | Status: AC
  Filled 2022-06-03: qty 20

## 2022-06-03 MED ORDER — MIDAZOLAM HCL 2 MG/2ML IJ SOLN
INTRAMUSCULAR | Status: AC
  Filled 2022-06-03: qty 2

## 2022-06-03 MED ORDER — ACETAMINOPHEN 500 MG PO TABS
1000.0000 mg | ORAL_TABLET | Freq: Once | ORAL | Status: AC
Start: 2022-06-03 — End: 2022-06-03
  Administered 2022-06-03: 1000 mg via ORAL
  Filled 2022-06-03: qty 2

## 2022-06-03 MED ORDER — OXYCODONE HCL 5 MG/5ML PO SOLN: 5.0000 mg | Freq: Once | ORAL | Status: DC | PRN

## 2022-06-03 MED ORDER — LIDOCAINE 2% (20 MG/ML) 5 ML SYRINGE
INTRAMUSCULAR | Status: DC | PRN
  Administered 2022-06-03: 60 mg via INTRAVENOUS

## 2022-06-03 MED ORDER — FENTANYL CITRATE (PF) 250 MCG/5ML IJ SOLN
INTRAMUSCULAR | Status: DC | PRN
  Administered 2022-06-03: 50 ug via INTRAVENOUS

## 2022-06-03 MED ORDER — ONDANSETRON HCL 4 MG/2ML IJ SOLN
INTRAMUSCULAR | Status: AC
  Filled 2022-06-03: qty 2

## 2022-06-03 MED ORDER — PROPOFOL 500 MG/50ML IV EMUL
INTRAVENOUS | Status: DC | PRN
  Administered 2022-06-03: 100 ug/kg/min via INTRAVENOUS

## 2022-06-03 MED ORDER — MIDAZOLAM HCL 2 MG/2ML IJ SOLN
2.0000 mg | Freq: Once | INTRAMUSCULAR | Status: AC
  Filled 2022-06-03: qty 2

## 2022-06-03 MED ORDER — CHLORHEXIDINE GLUCONATE 0.12 % MT SOLN
OROMUCOSAL | Status: AC
  Administered 2022-06-03: 15 mL via OROMUCOSAL
  Filled 2022-06-03: qty 15

## 2022-06-03 MED ORDER — OXYCODONE HCL 5 MG PO TABS: 5.0000 mg | ORAL_TABLET | Freq: Once | ORAL | Status: DC | PRN

## 2022-06-03 MED ORDER — DEXAMETHASONE SODIUM PHOSPHATE 10 MG/ML IJ SOLN
INTRAMUSCULAR | Status: DC | PRN
  Administered 2022-06-03: 5 mg

## 2022-06-03 MED ORDER — FENTANYL CITRATE (PF) 100 MCG/2ML IJ SOLN
INTRAMUSCULAR | Status: AC
  Administered 2022-06-03: 100 ug via INTRAVENOUS
  Filled 2022-06-03: qty 2

## 2022-06-03 MED ORDER — CEFAZOLIN SODIUM-DEXTROSE 2-4 GM/100ML-% IV SOLN
INTRAVENOUS | Status: AC
  Filled 2022-06-03: qty 100

## 2022-06-03 MED ORDER — CEFAZOLIN SODIUM-DEXTROSE 2-4 GM/100ML-% IV SOLN
2.0000 g | INTRAVENOUS | Status: AC
  Administered 2022-06-03: 2 g via INTRAVENOUS

## 2022-06-03 MED ORDER — ONDANSETRON HCL 4 MG/2ML IJ SOLN: 4.0000 mg | Freq: Once | INTRAMUSCULAR | Status: DC | PRN

## 2022-06-03 MED ORDER — FENTANYL CITRATE (PF) 250 MCG/5ML IJ SOLN
INTRAMUSCULAR | Status: AC
  Filled 2022-06-03: qty 5

## 2022-06-03 MED ORDER — FENTANYL CITRATE (PF) 100 MCG/2ML IJ SOLN
100.0000 ug | Freq: Once | INTRAMUSCULAR | Status: AC
  Filled 2022-06-03: qty 2

## 2022-06-03 MED ORDER — AMISULPRIDE (ANTIEMETIC) 5 MG/2ML IV SOLN: 10.0000 mg | Freq: Once | INTRAVENOUS | Status: DC | PRN

## 2022-06-03 MED ORDER — ROPIVACAINE HCL 5 MG/ML IJ SOLN
INTRAMUSCULAR | Status: DC | PRN
  Administered 2022-06-03: 25 mL via PERINEURAL
  Administered 2022-06-03: 15 mL via PERINEURAL

## 2022-06-03 MED ORDER — MIDAZOLAM HCL 2 MG/2ML IJ SOLN
INTRAMUSCULAR | Status: AC
  Administered 2022-06-03: 2 mg via INTRAVENOUS
  Filled 2022-06-03: qty 2

## 2022-06-03 MED ORDER — LIDOCAINE 2% (20 MG/ML) 5 ML SYRINGE
INTRAMUSCULAR | Status: AC
  Filled 2022-06-03: qty 5

## 2022-06-03 MED ORDER — 0.9 % SODIUM CHLORIDE (POUR BTL) OPTIME
TOPICAL | Status: DC | PRN
  Administered 2022-06-03: 1000 mL

## 2022-06-03 MED ORDER — MIDAZOLAM HCL 2 MG/2ML IJ SOLN
INTRAMUSCULAR | Status: DC | PRN
  Administered 2022-06-03 (×2): 1 mg via INTRAVENOUS

## 2022-06-03 MED ORDER — FENTANYL CITRATE (PF) 100 MCG/2ML IJ SOLN: 25.0000 ug | INTRAMUSCULAR | Status: DC | PRN

## 2022-06-03 MED ORDER — LACTATED RINGERS IV SOLN: INTRAVENOUS | Status: DC

## 2022-06-03 MED ORDER — ORAL CARE MOUTH RINSE: 15.0000 mL | Freq: Once | OROMUCOSAL | Status: AC

## 2022-06-03 MED ORDER — CHLORHEXIDINE GLUCONATE 0.12 % MT SOLN: 15.0000 mL | Freq: Once | OROMUCOSAL | Status: AC

## 2022-06-03 SURGICAL SUPPLY — 55 items
BAG COUNTER SPONGE SURGICOUNT (BAG) ×2 IMPLANT
BAG SPNG CNTER NS LX DISP (BAG) ×1
BANDAGE ESMARK 6X9 LF (GAUZE/BANDAGES/DRESSINGS) IMPLANT
BIT DRILL 3.0 6IN (DRILL) IMPLANT
BLADE SAW SGTL HD 18.5X60.5X1. (BLADE) ×3 IMPLANT
BLADE SURG 10 STRL SS (BLADE) IMPLANT
BLADE SURG 21 STRL SS (BLADE) ×1 IMPLANT
BNDG CMPR 5X6 CHSV STRCH STRL (GAUZE/BANDAGES/DRESSINGS) ×1
BNDG CMPR 9X6 STRL LF SNTH (GAUZE/BANDAGES/DRESSINGS)
BNDG COHESIVE 4X5 TAN ST LF (GAUZE/BANDAGES/DRESSINGS) ×1 IMPLANT
BNDG COHESIVE 4X5 TAN STRL (GAUZE/BANDAGES/DRESSINGS) ×2 IMPLANT
BNDG COHESIVE 6X5 TAN ST LF (GAUZE/BANDAGES/DRESSINGS) ×1 IMPLANT
BNDG ESMARK 6X9 LF (GAUZE/BANDAGES/DRESSINGS)
BNDG GAUZE DERMACEA FLUFF (GAUZE/BANDAGES/DRESSINGS) ×1
BNDG GAUZE DERMACEA FLUFF 4 (GAUZE/BANDAGES/DRESSINGS) IMPLANT
BNDG GAUZE ELAST 4 BULKY (GAUZE/BANDAGES/DRESSINGS) ×4 IMPLANT
BNDG GZE DERMACEA 4 6PLY (GAUZE/BANDAGES/DRESSINGS) ×1
CAP PIN ORTHO PINK (CAP) ×1 IMPLANT
COTTON STERILE ROLL (GAUZE/BANDAGES/DRESSINGS) ×2 IMPLANT
COVER MAYO STAND STRL (DRAPES) IMPLANT
COVER SURGICAL LIGHT HANDLE (MISCELLANEOUS) ×4 IMPLANT
DRAPE INCISE IOBAN 66X45 STRL (DRAPES) ×2 IMPLANT
DRAPE OEC MINIVIEW 54X84 (DRAPES) IMPLANT
DRAPE U-SHAPE 47X51 STRL (DRAPES) ×2 IMPLANT
DRILL 3.0 6IN (DRILL) ×2
DRSG ADAPTIC 3X8 NADH LF (GAUZE/BANDAGES/DRESSINGS) ×2 IMPLANT
DURAPREP 26ML APPLICATOR (WOUND CARE) ×2 IMPLANT
ELECT REM PT RETURN 9FT ADLT (ELECTROSURGICAL) ×2
ELECTRODE REM PT RTRN 9FT ADLT (ELECTROSURGICAL) ×1 IMPLANT
GAUZE PAD ABD 8X10 STRL (GAUZE/BANDAGES/DRESSINGS) ×1 IMPLANT
GAUZE SPONGE 4X4 12PLY STRL (GAUZE/BANDAGES/DRESSINGS) ×2 IMPLANT
GLOVE BIOGEL PI IND STRL 9 (GLOVE) ×1 IMPLANT
GLOVE BIOGEL PI INDICATOR 9 (GLOVE) ×1
GLOVE SURG ORTHO 9.0 STRL STRW (GLOVE) ×2 IMPLANT
GOWN STRL REUS W/ TWL XL LVL3 (GOWN DISPOSABLE) ×3 IMPLANT
GOWN STRL REUS W/TWL XL LVL3 (GOWN DISPOSABLE) ×6
GUIDEWIRE 1.6 6IN (WIRE) ×1 IMPLANT
K-WIRE SURGICAL 1.6X102 (WIRE) ×1 IMPLANT
KIT BASIN OR (CUSTOM PROCEDURE TRAY) ×2 IMPLANT
KIT TURNOVER KIT B (KITS) ×2 IMPLANT
MANIFOLD NEPTUNE II (INSTRUMENTS) ×2 IMPLANT
NS IRRIG 1000ML POUR BTL (IV SOLUTION) ×2 IMPLANT
PACK ORTHO EXTREMITY (CUSTOM PROCEDURE TRAY) ×2 IMPLANT
PAD ARMBOARD 7.5X6 YLW CONV (MISCELLANEOUS) ×4 IMPLANT
PAD CAST 4YDX4 CTTN HI CHSV (CAST SUPPLIES) ×1 IMPLANT
PADDING CAST COTTON 4X4 STRL (CAST SUPPLIES) ×2
SCREW CANN SHT HDLS 4X40 (Screw) ×2 IMPLANT
SPONGE T-LAP 18X18 ~~LOC~~+RFID (SPONGE) ×2 IMPLANT
SUCTION FRAZIER HANDLE 10FR (MISCELLANEOUS) ×2
SUCTION TUBE FRAZIER 10FR DISP (MISCELLANEOUS) ×1 IMPLANT
SUT ETHILON 2 0 PSLX (SUTURE) ×5 IMPLANT
TOWEL GREEN STERILE (TOWEL DISPOSABLE) ×2 IMPLANT
TOWEL GREEN STERILE FF (TOWEL DISPOSABLE) ×2 IMPLANT
TUBE CONNECTING 12X1/4 (SUCTIONS) ×2 IMPLANT
WATER STERILE IRR 1000ML POUR (IV SOLUTION) ×2 IMPLANT

## 2022-06-03 NOTE — Anesthesia Procedure Notes (Signed)
Procedure Name: MAC Date/Time: 06/03/2022 11:43 AM  Performed by: Alain Marion, CRNAPre-anesthesia Checklist: Patient identified, Emergency Drugs available, Suction available and Patient being monitored Patient Re-evaluated:Patient Re-evaluated prior to induction Oxygen Delivery Method: Simple face mask Placement Confirmation: positive ETCO2

## 2022-06-03 NOTE — Anesthesia Procedure Notes (Signed)
Anesthesia Regional Block: Popliteal block   Pre-Anesthetic Checklist: , timeout performed,  Correct Patient, Correct Site, Correct Laterality,  Correct Procedure, Correct Position, site marked,  Risks and benefits discussed,  Surgical consent,  Pre-op evaluation,  At surgeon's request and post-op pain management  Laterality: Left  Prep: chloraprep       Needles:  Injection technique: Single-shot  Needle Type: Echogenic Stimulator Needle     Needle Length: 10cm  Needle Gauge: 20     Additional Needles:   Procedures:,,,, ultrasound used (permanent image in chart),,    Narrative:  Start time: 06/03/2022 11:07 AM End time: 06/03/2022 11:10 AM  Performed by: Personally  Anesthesiologist: Lucretia Kern, MD  Additional Notes: Standard monitors applied. Skin prepped. Good needle visualization with ultrasound. Injection made in 5cc increments with no resistance to injection. Patient tolerated the procedure well.

## 2022-06-03 NOTE — Op Note (Signed)
06/03/2022  12:38 PM  PATIENT:  Micheal Malone    PRE-OPERATIVE DIAGNOSIS:  Lisfranc Dislocation Left Foot  POST-OPERATIVE DIAGNOSIS:  Same  PROCEDURE:  LEFT FOOT LISFRANC FUSION C arm fluoroscopy to verify reduction.   SURGEON:  Nadara Mustard, MD  PHYSICIAN ASSISTANT:None ANESTHESIA:   General  PREOPERATIVE INDICATIONS:  Micheal Malone is a  41 y.o. male with a diagnosis of Lisfranc Dislocation Left Foot who failed conservative measures and elected for surgical management.    The risks benefits and alternatives were discussed with the patient preoperatively including but not limited to the risks of infection, bleeding, nerve injury, cardiopulmonary complications, the need for revision surgery, among others, and the patient was willing to proceed.  OPERATIVE IMPLANTS: 4.0 headless cannulated screws x2.0625 K wire x1.  @ENCIMAGES @  OPERATIVE FINDINGS: C-arm possibly verified reduction of the Lisfranc complex fusion the base of the first metatarsal and stabilization of the second metatarsal fracture.  OPERATIVE PROCEDURE: Patient was brought the operating room after undergoing a regional anesthetic.  After adequate levels anesthesia obtained patient's left lower extremity was prepped using DuraPrep draped into a sterile field a timeout was called.  A dorsal incision was made over the first webspace.  This was carried down to the EHL tendon and the EHL tendon was retracted laterally and the dissection was carried down to the base of the first metatarsal medial cuneiform.  The joint was extremely unstable.  A oscillating saw was used to remove the bone from the base of the first metatarsal medial cuneiform joint this was debrided down to healthy viable subchondral bone.  The joint was reduced and stabilized with a K wire.  C-arm fluoroscopy verified alignment and a 40 mm headless cannulated screw was used to stabilize the joint.  Attention was then focused on the metatarsal shaft  fracture second metatarsal.  Blunt dissection was carried down to the fracture the bone was extremely small and a 0.0625 K wire filled the canal a plate was not used for stabilization due to the small metatarsal.  A 0.0625 K wire was inserted from the fracture site through the distal fragment antegrade and then the bone was reduced and was inserted retrograde for intramedullary fixation.  The wire was trimmed just distal to the skin and a pin cap was placed.  Attention was then focused on the Lisfranc complex and a wire was inserted from the medial cuneiform to the base of the second metatarsal C-arm possibly verified reduction and a 40 mm headless cannulated screw was used to stabilize the joint.  C-arm possibly verified reduction.  The wound was irrigated with normal saline incision closed using 2-0 nylon a sterile dressing was applied patient was taken the PACU in stable condition.   DISCHARGE PLANNING:  Antibiotic duration: Preoperative antibiotics.  Weightbearing: Nonweightbearing on the left  Pain medication: Patient has a prescription for Percocet  Dressing care/ Wound VAC: Follow-up in the office 1 week to change the dressing  Ambulatory devices: Crutches walker or kneeling scooter.  Discharge to: Home.  Follow-up: In the office 1 week post operative.
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
## 2022-06-03 NOTE — Anesthesia Preprocedure Evaluation (Signed)
Anesthesia Evaluation  Patient identified by MRN, date of birth, ID band Patient awake    Reviewed: Allergy & Precautions, NPO status , Patient's Chart, lab work & pertinent test results  History of Anesthesia Complications Negative for: history of anesthetic complications  Airway Mallampati: II  TM Distance: >3 FB Neck ROM: Full    Dental  (+) Teeth Intact   Pulmonary Current Smoker,    Pulmonary exam normal        Cardiovascular negative cardio ROS Normal cardiovascular exam     Neuro/Psych negative neurological ROS     GI/Hepatic negative GI ROS, Neg liver ROS,   Endo/Other  negative endocrine ROS  Renal/GU negative Renal ROS  negative genitourinary   Musculoskeletal negative musculoskeletal ROS (+)   Abdominal   Peds  Hematology negative hematology ROS (+)   Anesthesia Other Findings Left foot Lisfranc dislocation  Reproductive/Obstetrics                             Anesthesia Physical Anesthesia Plan  ASA: 2  Anesthesia Plan: MAC and Regional   Post-op Pain Management: Regional block*, Tylenol PO (pre-op)* and Toradol IV (intra-op)*   Induction: Intravenous  PONV Risk Score and Plan: 1 and Propofol infusion, TIVA and Treatment may vary due to age or medical condition  Airway Management Planned: Natural Airway, Nasal Cannula and Simple Face Mask  Additional Equipment: None  Intra-op Plan:   Post-operative Plan:   Informed Consent: I have reviewed the patients History and Physical, chart, labs and discussed the procedure including the risks, benefits and alternatives for the proposed anesthesia with the patient or authorized representative who has indicated his/her understanding and acceptance.     Dental advisory given  Plan Discussed with:   Anesthesia Plan Comments:         Anesthesia Quick Evaluation

## 2022-06-03 NOTE — Anesthesia Postprocedure Evaluation (Signed)
Anesthesia Post Note  Patient: Micheal Malone  Procedure(s) Performed: LEFT FOOT LISFRANC FUSION (Left: Foot)     Patient location during evaluation: PACU Anesthesia Type: Regional Level of consciousness: awake and alert Pain management: pain level controlled Vital Signs Assessment: post-procedure vital signs reviewed and stable Respiratory status: spontaneous breathing, nonlabored ventilation and respiratory function stable Cardiovascular status: blood pressure returned to baseline and stable Postop Assessment: no apparent nausea or vomiting Anesthetic complications: no   No notable events documented.  Last Vitals:  Vitals:   06/03/22 1330 06/03/22 1345  BP: 117/77 111/78  Pulse: (!) 59 (!) 55  Resp: 17 18  Temp:    SpO2: 94% 95%    Last Pain:  Vitals:   06/03/22 1345  TempSrc:   PainSc: Asleep                 Lucretia Kern

## 2022-06-03 NOTE — Anesthesia Procedure Notes (Signed)
Anesthesia Regional Block: Adductor canal block   Pre-Anesthetic Checklist: , timeout performed,  Correct Patient, Correct Site, Correct Laterality,  Correct Procedure, Correct Position, site marked,  Risks and benefits discussed,  Surgical consent,  Pre-op evaluation,  At surgeon's request and post-op pain management  Laterality: Left  Prep: chloraprep       Needles:  Injection technique: Single-shot  Needle Type: Echogenic Stimulator Needle     Needle Length: 10cm  Needle Gauge: 20     Additional Needles:   Procedures:,,,, ultrasound used (permanent image in chart),,    Narrative:  Start time: 06/03/2022 11:04 AM End time: 06/03/2022 11:07 AM Injection made incrementally with aspirations every 5 mL.  Performed by: Personally  Anesthesiologist: Lucretia Kern, MD  Additional Notes: Standard monitors applied. Skin prepped. Good needle visualization with ultrasound. Injection made in 5cc increments with no resistance to injection. Patient tolerated the procedure well.

## 2022-06-03 NOTE — Transfer of Care (Signed)
Immediate Anesthesia Transfer of Care Note  Patient: Micheal Malone  Procedure(s) Performed: LEFT FOOT LISFRANC FUSION (Left: Foot)  Patient Location: PACU  Anesthesia Type:MAC combined with regional for post-op pain  Level of Consciousness: drowsy, patient cooperative and responds to stimulation  Airway & Oxygen Therapy: Patient Spontanous Breathing and Patient connected to nasal cannula oxygen  Post-op Assessment: Report given to RN and Post -op Vital signs reviewed and stable  Post vital signs: Reviewed and stable  Last Vitals:  Vitals Value Taken Time  BP 106/71 06/03/22 1245  Temp    Pulse 66 06/03/22 1246  Resp 17 06/03/22 1246  SpO2 97 % 06/03/22 1246  Vitals shown include unvalidated device data.  Last Pain:  Vitals:   06/03/22 1114  TempSrc:   PainSc: 8       Patients Stated Pain Goal: 2 (97/74/14 2395)  Complications: No notable events documented.

## 2022-06-05 ENCOUNTER — Encounter: Payer: Self-pay | Admitting: Orthopedic Surgery

## 2022-06-06 ENCOUNTER — Telehealth: Payer: Self-pay | Admitting: Orthopedic Surgery

## 2022-06-06 ENCOUNTER — Encounter (HOSPITAL_COMMUNITY): Payer: Self-pay | Admitting: Orthopedic Surgery

## 2022-06-06 NOTE — Telephone Encounter (Signed)
Patient is having a burning pain and needs to be worked in for a wound check he had swelling and he called the nurse and she told him to unwrap his foot because the pressure was too much, tried calling triage no answer

## 2022-06-06 NOTE — Telephone Encounter (Signed)
SW pt on phone, he called on call MD this w/e. They advised him to cut the side of wrap down leg a little bit for relief. Stating that the inner sterile wrap is very hard from dried blood and that is what is pushing into skin and incision. I advised okay to take off carefully and rewrap foot. He did state he felt comfortable with doing this on his own. If any issues call us. I reminded him to keep his foot/leg elevated above chest level.

## 2022-06-06 NOTE — Telephone Encounter (Signed)
SW pt on phone, he called on call MD this w/e. They advised him to cut the side of wrap down leg a little bit for relief. Stating that the inner sterile wrap is very hard from dried blood and that is what is pushing into skin and incision. I advised okay to take off carefully and rewrap foot. He did state he felt comfortable with doing this on his own. If any issues call us. I reminded him to keep his foot/leg elevated above chest level. He has a f/u apt on Friday

## 2022-06-07 ENCOUNTER — Telehealth: Payer: Self-pay | Admitting: Orthopedic Surgery

## 2022-06-07 NOTE — Telephone Encounter (Signed)
Pt called requesting a call back from Autumn F. Pt states his pain is so bad that his blood pressure is extremely high. Please call pt at [phone]

## 2022-06-07 NOTE — Telephone Encounter (Signed)
I called pt and advised to continue with elevation of foot higher than his heart, to use ice 15 minutes at a time 3-4 times a day advised can use aleve 2 po bid as per protocol and made an appt for him to come in  and see Erin tomorrow at 8:30

## 2022-06-08 ENCOUNTER — Encounter: Payer: Self-pay | Admitting: Family

## 2022-06-08 ENCOUNTER — Telehealth: Payer: Self-pay

## 2022-06-08 ENCOUNTER — Ambulatory Visit (INDEPENDENT_AMBULATORY_CARE_PROVIDER_SITE_OTHER): Payer: No Typology Code available for payment source | Admitting: Family

## 2022-06-08 DIAGNOSIS — S93325A Dislocation of tarsometatarsal joint of left foot, initial encounter: Secondary | ICD-10-CM

## 2022-06-08 DIAGNOSIS — R030 Elevated blood-pressure reading, without diagnosis of hypertension: Secondary | ICD-10-CM

## 2022-06-08 MED ORDER — OXYCODONE-ACETAMINOPHEN 5-325 MG PO TABS: 1.0000 | ORAL_TABLET | ORAL | 0 refills | Status: DC | PRN

## 2022-06-08 NOTE — Telephone Encounter (Signed)
Prior auth started in cover my meds for rx given today. Will hold pending approval.

## 2022-06-08 NOTE — Progress Notes (Signed)
Post-Op Visit Note   Patient: Micheal Malone           Date of Birth: [birth year]           MRN: [id number] Visit Date: 06/08/2022 PCP: Patient, No Pcp Per  Chief Complaint:  Chief Complaint  Patient presents with   Left Foot - Routine Post Op    06/03/22 lisfranc fusion    HPI:  HPI The patient is a 41 year old gentleman who presents status post left foot Lisfranc fusion on July 28 he is having significant pain he reports that anytime he has his foot dependent when he is using the crutches to get to the bathroom he has shooting and stabbing pain in his foot he relates that his blood pressures have been quite elevated due to pain.  He does not have issues with hypertension at baseline to his knowledge  Has been using his Percocet every 4 hours due to pain feels this is not adequately covering his pain Ortho Exam On examination of the left foot there is mild edema with some erythema and ecchymosis.  There is no weeping no drainage from his incision which is well approximated with sutures there is a pin present to the plantar aspect of his foot there is no hypersensitivity to light touch.  Toes are normothermic he is able to wiggle all 5 toes  Visit Diagnoses: No diagnosis found.  Plan: Refill for his Percocet provided.  He will follow-up in 1 week with radiographs of the left foot.  Continue nonweightbearing.  Paint the pin tract with Pearson or Neosporin dry dressings to the incision  Follow-Up Instructions: Return in about 8 days (around 06/16/2022).   Imaging: No results found.  Orders:  No orders of the defined types were placed in this encounter.  No orders of the defined types were placed in this encounter.    PMFS History: Patient Active Problem List   Diagnosis Date Noted   Lisfranc dislocation, left, initial encounter    Costochondritis 08/07/2020   Chest pain 08/06/2020   Past Medical History:  Diagnosis Date   History of kidney stones    Left ureteral  stone    Wears glasses     Family History  Problem Relation Age of Onset   Cerebral aneurysm Father    Heart failure Father     Past Surgical History:  Procedure Laterality Date   CYSTOSCOPY/URETEROSCOPY/HOLMIUM LASER/STENT PLACEMENT Left 02/20/2017   Procedure: CYSTOSCOPY LEFT URETEROSCOPY LEFT RETROGRADE PYELOGRAM Manning Charity LASER LITHO / STONE BASKETRY /STENT PLACEMENT;  Surgeon: Ihor Gully, MD;  Location: [facility];  Service: Urology;  Laterality: Left;   FOOT ARTHRODESIS Left 06/03/2022   Procedure: LEFT FOOT LISFRANC FUSION;  Surgeon: Nadara Mustard, MD;  Location: [facility] OR;  Service: Orthopedics;  Laterality: Left;   VASECTOMY Bilateral 02/20/2017   Procedure: VASECTOMY BILATERAL;  Surgeon: Ihor Gully, MD;  Location: [facility];  Service: Urology;  Laterality: Bilateral;   Social History   Occupational History   Not on file  Tobacco Use   Smoking status: Every Day    Types: E-cigarettes   Smokeless tobacco: Former    Types: Snuff    Quit date: 02/16/2016  Vaping Use   Vaping Use: Every day   Substances: Nicotine  Substance and Sexual Activity   Alcohol use: Yes    Alcohol/week: 10.0 standard drinks of alcohol    Types: 10 Cans of beer per week   Drug use: No  Sexual activity: Not on file

## 2022-06-08 NOTE — Addendum Note (Signed)
Addended by: Adonis Huguenin on: 06/08/2022 09:24 AM   Modules accepted: Orders

## 2022-06-09 NOTE — Telephone Encounter (Signed)
Checked portal and decision still pending. Will continue to hold and monitor.

## 2022-06-10 ENCOUNTER — Encounter: Payer: No Typology Code available for payment source | Admitting: Family

## 2022-06-10 NOTE — Telephone Encounter (Signed)
Checked faxes and checked portal and no decision made on pain medication.

## 2022-06-15 ENCOUNTER — Ambulatory Visit (INDEPENDENT_AMBULATORY_CARE_PROVIDER_SITE_OTHER): Payer: No Typology Code available for payment source

## 2022-06-15 ENCOUNTER — Encounter: Payer: Self-pay | Admitting: Family

## 2022-06-15 ENCOUNTER — Ambulatory Visit (INDEPENDENT_AMBULATORY_CARE_PROVIDER_SITE_OTHER): Payer: No Typology Code available for payment source | Admitting: Family

## 2022-06-15 DIAGNOSIS — S93325A Dislocation of tarsometatarsal joint of left foot, initial encounter: Secondary | ICD-10-CM

## 2022-06-15 NOTE — Progress Notes (Signed)
Post-Op Visit Note   Patient: Micheal Malone           Date of Birth: 1981/01/09           MRN: [id number] Visit Date: 06/15/2022 PCP: Patient, No Pcp Per  Chief Complaint:  Chief Complaint  Patient presents with   Left Foot - Routine Post Op    06/03/22 lisfranc fusion    HPI:  HPI The patient is a 41 year old gentleman who is seen status post Lisfranc fusion as well as pinning of the second metatarsal shaft fracture has been nonweightbearing with crutches Ortho Exam On examination of the left foot there is improved swelling there is no erythema no redness or warmth his incision is well-approximated with sutures healing well Pin pulled from plantar aspect of foot patient tolerated well  Visit Diagnoses:  1. Lisfranc dislocation, left, initial encounter     Plan: Continue daily Dial soap cleansing.  Continue nonweightbearing.  Encouraged him to resume his cam walker radiographs of the left foot at follow-up anticipate advancing weightbearing  Follow-Up Instructions: Return in about 15 days (around 06/30/2022).   Imaging: XR Foot Complete Left  Result Date: 06/15/2022 Radiographs of left foot show stable alignment of fixation hardware. Pin in place to 2nd metatarsal fracture. This is well aligned. Third and 4th metatarsal fractures without displacement.    Orders:  Orders Placed This Encounter  Procedures   XR Foot Complete Left   No orders of the defined types were placed in this encounter.    PMFS History: Patient Active Problem List   Diagnosis Date Noted   Lisfranc dislocation, left, initial encounter    Costochondritis 08/07/2020   Chest pain 08/06/2020   Past Medical History:  Diagnosis Date   History of kidney stones    Left ureteral stone    Wears glasses     Family History  Problem Relation Age of Onset   Cerebral aneurysm Father    Heart failure Father     Past Surgical History:  Procedure Laterality Date   CYSTOSCOPY/URETEROSCOPY/HOLMIUM  LASER/STENT PLACEMENT Left 02/20/2017   Procedure: CYSTOSCOPY LEFT URETEROSCOPY LEFT RETROGRADE PYELOGRAM Manning Charity LASER LITHO / STONE BASKETRY /STENT PLACEMENT;  Surgeon: Ihor Gully, MD;  Location: [facility];  Service: Urology;  Laterality: Left;   FOOT ARTHRODESIS Left 06/03/2022   Procedure: LEFT FOOT LISFRANC FUSION;  Surgeon: Nadara Mustard, MD;  Location: [facility] OR;  Service: Orthopedics;  Laterality: Left;   VASECTOMY Bilateral 02/20/2017   Procedure: VASECTOMY BILATERAL;  Surgeon: Ihor Gully, MD;  Location: [facility];  Service: Urology;  Laterality: Bilateral;   Social History   Occupational History   Not on file  Tobacco Use   Smoking status: Every Day    Types: E-cigarettes   Smokeless tobacco: Former    Types: Snuff    Quit date: 02/16/2016  Vaping Use   Vaping Use: Every day   Substances: Nicotine  Substance and Sexual Activity   Alcohol use: Yes    Alcohol/week: 10.0 standard drinks of alcohol    Types: 10 Cans of beer per week   Drug use: No   Sexual activity: Not on file

## 2022-06-21 ENCOUNTER — Telehealth: Payer: Self-pay | Admitting: Orthopedic Surgery

## 2022-06-21 MED ORDER — OXYCODONE-ACETAMINOPHEN 5-325 MG PO TABS: 1.0000 | ORAL_TABLET | Freq: Four times a day (QID) | ORAL | 0 refills | Status: DC | PRN

## 2022-06-21 NOTE — Telephone Encounter (Signed)
Pt called requesting to have an appt. Pt states he is in severe [pain and had surgery. About 17 days ago and his pain level is very severe and asking to come into office. Please call pt about this matter at [phone].

## 2022-06-29 ENCOUNTER — Encounter: Payer: Self-pay | Admitting: Family

## 2022-06-29 ENCOUNTER — Ambulatory Visit: Payer: Self-pay

## 2022-06-29 ENCOUNTER — Ambulatory Visit (INDEPENDENT_AMBULATORY_CARE_PROVIDER_SITE_OTHER): Payer: No Typology Code available for payment source | Admitting: Family

## 2022-06-29 DIAGNOSIS — S93325A Dislocation of tarsometatarsal joint of left foot, initial encounter: Secondary | ICD-10-CM

## 2022-06-29 MED ORDER — OXYCODONE-ACETAMINOPHEN 5-325 MG PO TABS
1.0000 | ORAL_TABLET | Freq: Four times a day (QID) | ORAL | 0 refills | Status: DC | PRN
Start: 2022-06-29 — End: 2023-08-15

## 2022-06-29 MED ORDER — GABAPENTIN 300 MG PO CAPS: 300.0000 mg | ORAL_CAPSULE | Freq: Three times a day (TID) | ORAL | 1 refills | Status: DC

## 2022-06-29 NOTE — Progress Notes (Signed)
Post-Op Visit Note   Patient: Micheal Malone           Date of Birth: 1981-09-21           MRN: [id number] Visit Date: 06/29/2022 PCP: Patient, No Pcp Per  Chief Complaint:  Chief Complaint  Patient presents with   Left Foot - Routine Post Op    06/03/2022 left Lisfranc fusion    HPI:  HPI The patient is a 41 year old gentleman seen about 1 month status post Lisfranc fusion as well as fractures of the third and fourth metatarsals.  He has been in a cam walker states he uses this he has returned to work for seated work he is using crutches for nonweightbearing complains of heel cord tightness pain when he wears the boot and his heel cord  Also complaining of numbness tingling and discomfort along the medial column difficulty sleeping due to foot pain and some throbbing on the plantar aspect of his foot Ortho Exam On examination of the left foot his incision is well-healed there are 2 remaining sutures these were harvested today without incident there is no gaping or drainage no erythema there is very minimal edema no hypersensitivity to light touch  Visit Diagnoses:  1. Lisfranc dislocation, left, initial encounter     Plan: Will begin gabapentin at bedtime for the next 1 week have refilled his pain medication he may begin touchdown weightbearing in the cam walker if he is pain-free he may advance his weightbearing in the cam.  He will follow-up once more in 2 weeks with radiographs of his left foot  Follow-Up Instructions: No follow-ups on file.   Imaging: No results found.  Orders:  Orders Placed This Encounter  Procedures   XR Foot Complete Left   Meds ordered this encounter  Medications   oxyCODONE-acetaminophen (PERCOCET) 5-325 MG tablet    Sig: Take 1 tablet by mouth every 6 (six) hours as needed for severe pain.    Dispense:  30 tablet    Refill:  0   gabapentin (NEURONTIN) 300 MG capsule    Sig: Take 1 capsule (300 mg total) by mouth 3 (three) times daily.     Dispense:  90 capsule    Refill:  1     PMFS History: Patient Active Problem List   Diagnosis Date Noted   Lisfranc dislocation, left, initial encounter    Costochondritis 08/07/2020   Chest pain 08/06/2020   Past Medical History:  Diagnosis Date   History of kidney stones    Left ureteral stone    Wears glasses     Family History  Problem Relation Age of Onset   Cerebral aneurysm Father    Heart failure Father     Past Surgical History:  Procedure Laterality Date   CYSTOSCOPY/URETEROSCOPY/HOLMIUM LASER/STENT PLACEMENT Left 02/20/2017   Procedure: CYSTOSCOPY LEFT URETEROSCOPY LEFT RETROGRADE PYELOGRAM Manning Charity LASER LITHO / STONE BASKETRY /STENT PLACEMENT;  Surgeon: Ihor Gully, MD;  Location: [facility];  Service: Urology;  Laterality: Left;   FOOT ARTHRODESIS Left 06/03/2022   Procedure: LEFT FOOT LISFRANC FUSION;  Surgeon: Nadara Mustard, MD;  Location: [facility] OR;  Service: Orthopedics;  Laterality: Left;   VASECTOMY Bilateral 02/20/2017   Procedure: VASECTOMY BILATERAL;  Surgeon: Ihor Gully, MD;  Location: [facility];  Service: Urology;  Laterality: Bilateral;   Social History   Occupational History   Not on file  Tobacco Use   Smoking status: Every Day    Types:  E-cigarettes   Smokeless tobacco: Former    Types: Snuff    Quit date: 02/16/2016  Vaping Use   Vaping Use: Every day   Substances: Nicotine  Substance and Sexual Activity   Alcohol use: Yes    Alcohol/week: 10.0 standard drinks of alcohol    Types: 10 Cans of beer per week   Drug use: No   Sexual activity: Not on file

## 2022-07-13 ENCOUNTER — Ambulatory Visit: Payer: Self-pay

## 2022-07-13 ENCOUNTER — Ambulatory Visit (INDEPENDENT_AMBULATORY_CARE_PROVIDER_SITE_OTHER): Payer: No Typology Code available for payment source | Admitting: Family

## 2022-07-13 ENCOUNTER — Encounter: Payer: Self-pay | Admitting: Family

## 2022-07-13 DIAGNOSIS — M79672 Pain in left foot: Secondary | ICD-10-CM

## 2022-07-13 NOTE — Progress Notes (Signed)
Post-Op Visit Note   Patient: Micheal Malone           Date of Birth: 1980-12-29           MRN: [id number] Visit Date: 07/13/2022 PCP: Patient, No Pcp Per  Chief Complaint:  Chief Complaint  Patient presents with   Left Foot - Routine Post Op    06/03/2022 left Lisfranc fusion     HPI:  HPI The patient is a 41 year old gentleman seen follow-up status post left Lisfranc fusion as well as metatarsal fractures of the left foot he has been in a cam walker full weightbearing.  States this is pain-free  Ortho Exam On examination of the left foot his incision is well-healed there is no drainage no erythema minimal edema no significant tenderness of his incision or metatarsals  Visit Diagnoses:  1. Pain in left foot     Plan: May advance to postop shoe discontinue the cam walker may return to work.  He will continue his gabapentin at night  Follow-Up Instructions: No follow-ups on file.   Imaging: No results found.  Orders:  Orders Placed This Encounter  Procedures   XR Foot 2 Views Left   No orders of the defined types were placed in this encounter.    PMFS History: Patient Active Problem List   Diagnosis Date Noted   Lisfranc dislocation, left, initial encounter    Costochondritis 08/07/2020   Chest pain 08/06/2020   Past Medical History:  Diagnosis Date   History of kidney stones    Left ureteral stone    Wears glasses     Family History  Problem Relation Age of Onset   Cerebral aneurysm Father    Heart failure Father     Past Surgical History:  Procedure Laterality Date   CYSTOSCOPY/URETEROSCOPY/HOLMIUM LASER/STENT PLACEMENT Left 02/20/2017   Procedure: CYSTOSCOPY LEFT URETEROSCOPY LEFT RETROGRADE PYELOGRAM Manning Charity LASER LITHO / STONE BASKETRY /STENT PLACEMENT;  Surgeon: Ihor Gully, MD;  Location: [facility];  Service: Urology;  Laterality: Left;   FOOT ARTHRODESIS Left 06/03/2022   Procedure: LEFT FOOT LISFRANC FUSION;  Surgeon:  Nadara Mustard, MD;  Location: [facility] OR;  Service: Orthopedics;  Laterality: Left;   VASECTOMY Bilateral 02/20/2017   Procedure: VASECTOMY BILATERAL;  Surgeon: Ihor Gully, MD;  Location: [facility];  Service: Urology;  Laterality: Bilateral;   Social History   Occupational History   Not on file  Tobacco Use   Smoking status: Every Day    Types: E-cigarettes   Smokeless tobacco: Former    Types: Snuff    Quit date: 02/16/2016  Vaping Use   Vaping Use: Every day   Substances: Nicotine  Substance and Sexual Activity   Alcohol use: Yes    Alcohol/week: 10.0 standard drinks of alcohol    Types: 10 Cans of beer per week   Drug use: No   Sexual activity: Not on file

## 2022-07-28 ENCOUNTER — Ambulatory Visit (HOSPITAL_BASED_OUTPATIENT_CLINIC_OR_DEPARTMENT_OTHER): Payer: No Typology Code available for payment source | Admitting: Family Medicine

## 2022-09-14 NOTE — Telephone Encounter (Signed)
Should probably have him come in

## 2022-09-20 ENCOUNTER — Ambulatory Visit (HOSPITAL_BASED_OUTPATIENT_CLINIC_OR_DEPARTMENT_OTHER): Payer: No Typology Code available for payment source | Admitting: Family Medicine

## 2022-10-06 ENCOUNTER — Telehealth: Payer: Self-pay | Admitting: Orthopedic Surgery

## 2022-10-06 NOTE — Telephone Encounter (Signed)
Called patient to schedule an appointment with Dr. Lajoyce Corners on any Monday in December patient would like. I got recording patient's mailbox is full and could not leave message. [phone]

## 2022-10-13 ENCOUNTER — Telehealth: Payer: Self-pay | Admitting: Orthopedic Surgery

## 2022-10-13 NOTE — Telephone Encounter (Signed)
I'll send him a mychart message stating we are trying to get a hold of him for appt.

## 2022-10-13 NOTE — Telephone Encounter (Signed)
Called patient again got recording mailbox is still full and could not leave message.     Called patient on 10/06/2022 mailbox was full at that time as well

## 2023-08-13 ENCOUNTER — Emergency Department (HOSPITAL_COMMUNITY)
Admission: EM | Admit: 2023-08-13 | Discharge: 2023-08-13 | Disposition: A | Discharge status: Home / Self Care | Payer: BC Managed Care – PPO | Attending: Emergency Medicine | Admitting: Emergency Medicine

## 2023-08-13 ENCOUNTER — Emergency Department (HOSPITAL_COMMUNITY): Payer: BC Managed Care – PPO

## 2023-08-13 ENCOUNTER — Other Ambulatory Visit: Payer: Self-pay

## 2023-08-13 DIAGNOSIS — N201 Calculus of ureter: Secondary | ICD-10-CM | POA: Diagnosis not present

## 2023-08-13 DIAGNOSIS — R109 Unspecified abdominal pain: Secondary | ICD-10-CM | POA: Diagnosis present

## 2023-08-13 HISTORY — DX: Calculus of ureter: N20.1

## 2023-08-13 LAB — COMPREHENSIVE METABOLIC PANEL
ALT: 25 U/L (ref 0–44)
AST: 20 U/L (ref 15–41)
Albumin: 4.6 g/dL (ref 3.5–5.0)
Alkaline Phosphatase: 51 U/L (ref 38–126)
Anion gap: 15 (ref 5–15)
BUN: 14 mg/dL (ref 6–20)
CO2: 21 mmol/L — ABNORMAL LOW (ref 22–32)
Calcium: 9.2 mg/dL (ref 8.9–10.3)
Chloride: 101 mmol/L (ref 98–111)
Creatinine, Ser: 1.03 mg/dL (ref 0.61–1.24)
GFR, Estimated: >60 mL/min (ref >60)
Glucose, Bld: 115 mg/dL — ABNORMAL HIGH (ref 70–99)
Potassium: 3.9 mmol/L (ref 3.5–5.1)
Sodium: 137 mmol/L (ref 135–145)
Total Bilirubin: 1.3 mg/dL — ABNORMAL HIGH (ref 0.3–1.2)
Total Protein: 7.6 g/dL (ref 6.5–8.1)

## 2023-08-13 LAB — CBC WITH DIFFERENTIAL/PLATELET
Abs Immature Granulocytes: 0.04 10*3/uL (ref 0.00–0.07)
Basophils Absolute: 0.1 10*3/uL (ref 0.0–0.1)
Basophils Relative: 1 %
Eosinophils Absolute: 0.2 10*3/uL (ref 0.0–0.5)
Eosinophils Relative: 2 %
HCT: 47.5 % (ref 39.0–52.0)
Hemoglobin: 16.5 g/dL (ref 13.0–17.0)
Immature Granulocytes: 0 %
Lymphocytes Relative: 25 %
Lymphs Abs: 2.3 10*3/uL (ref 0.7–4.0)
MCH: 30.4 pg (ref 26.0–34.0)
MCHC: 34.7 g/dL (ref 30.0–36.0)
MCV: 87.6 fL (ref 80.0–100.0)
Monocytes Absolute: 0.7 10*3/uL (ref 0.1–1.0)
Monocytes Relative: 8 %
Neutro Abs: 6 10*3/uL (ref 1.7–7.7)
Neutrophils Relative %: 64 %
Platelets: 399 10*3/uL (ref 150–400)
RBC: 5.42 MIL/uL (ref 4.22–5.81)
RDW: 12 % (ref 11.5–15.5)
WBC: 9.3 10*3/uL (ref 4.0–10.5)
nRBC: 0 % (ref 0.0–0.2)

## 2023-08-13 LAB — URINALYSIS, ROUTINE W REFLEX MICROSCOPIC
Bilirubin Urine: NEGATIVE
Glucose, UA: NEGATIVE mg/dL
Ketones, ur: NEGATIVE mg/dL
Leukocytes,Ua: NEGATIVE
Nitrite: NEGATIVE
Protein, ur: 30 mg/dL — AB
RBC / HPF: >50 RBC/hpf (ref 0–5)
Specific Gravity, Urine: 1.021 (ref 1.005–1.030)
pH: 6 (ref 5.0–8.0)

## 2023-08-13 LAB — LIPASE, BLOOD: Lipase: 29 U/L (ref 11–51)

## 2023-08-13 MED ORDER — ONDANSETRON HCL 4 MG/2ML IJ SOLN
4.0000 mg | Freq: Once | INTRAMUSCULAR | Status: AC
Start: 2023-08-13 — End: 2023-08-13
  Administered 2023-08-13: 4 mg via INTRAVENOUS
  Filled 2023-08-13: qty 2

## 2023-08-13 MED ORDER — TAMSULOSIN HCL 0.4 MG PO CAPS: 0.4000 mg | ORAL_CAPSULE | Freq: Every day | ORAL | 0 refills | Status: AC

## 2023-08-13 MED ORDER — KETOROLAC TROMETHAMINE 10 MG PO TABS: 10.0000 mg | ORAL_TABLET | Freq: Three times a day (TID) | ORAL | 0 refills | Status: DC | PRN

## 2023-08-13 MED ORDER — SODIUM CHLORIDE 0.9 % IV BOLUS
1000.0000 mL | Freq: Once | INTRAVENOUS | Status: AC
  Administered 2023-08-13: 1000 mL via INTRAVENOUS

## 2023-08-13 MED ORDER — HYDROMORPHONE HCL 1 MG/ML IJ SOLN
1.0000 mg | Freq: Once | INTRAMUSCULAR | Status: AC
  Administered 2023-08-13: 1 mg via INTRAVENOUS
  Filled 2023-08-13: qty 1

## 2023-08-13 MED ORDER — KETOROLAC TROMETHAMINE 30 MG/ML IJ SOLN
15.0000 mg | Freq: Once | INTRAMUSCULAR | Status: AC
Start: 2023-08-13 — End: 2023-08-13
  Administered 2023-08-13: 15 mg via INTRAVENOUS
  Filled 2023-08-13: qty 1

## 2023-08-13 MED ORDER — OXYCODONE HCL 5 MG PO TABS: 5.0000 mg | ORAL_TABLET | Freq: Four times a day (QID) | ORAL | 0 refills | Status: DC | PRN

## 2023-08-13 NOTE — ED Triage Notes (Signed)
Pt. Stated, Im sure I have a kidney stone , around midnight I started having right flank pain around to my stomach. It eased off and came back bad around 0700.  Ive had plenty of them I know. Ive had N/V cause of the pain.

## 2023-08-13 NOTE — Discharge Instructions (Addendum)
You have been seen today for your complaint of right-sided flank pain. Your lab work was reassuring. Your imaging showed a 6 mm stone in your right ureter. Your discharge medications include Flomax.  This is a medicine used to help pass your stone. Oxycodone. This is an opioid pain medication. You should only take this medication as needed for severe pain. You should not drive, operate heavy machinery or make important decisions while taking this medication. You should use alternative methods for pain relief while taking this medication including stretching, gentle range of motion, and alternating tylenol and ibuprofen. Toradol.  This is an NSAID pain medication.  Take it every 8 hours as needed.  Do not take ibuprofen or Aleve while taking this medication. Home care instructions are as follows:  Drink plenty of water Follow up with: Dr. Laverle Patter.  He is a urologist.  Call to schedule a follow-up appointment Please seek immediate medical care if you develop any of the following symptoms: You have a fever or chills. You develop severe pain. You develop new abdominal pain. You faint. You are unable to urinate. At this time there does not appear to be the presence of an emergent medical condition, however there is always the potential for conditions to change. Please read and follow the below instructions.  Do not take your medicine if  develop an itchy rash, swelling in your mouth or lips, or difficulty breathing; call 911 and seek immediate emergency medical attention if this occurs.  You may review your lab tests and imaging results in their entirety on your MyChart account.  Please discuss all results of fully with your primary care provider and other specialist at your follow-up visit.  Note: Portions of this text may have been transcribed using voice recognition software. Every effort was made to ensure accuracy; however, inadvertent computerized transcription errors may still be present.

## 2023-08-13 NOTE — ED Provider Notes (Signed)
[facility] AT [facility] Provider Note   CSN: [id number] Arrival date & time: 08/13/23  4098     History  Chief Complaint  Patient presents with   Flank Pain    EDRIAN Malone is a 42 y.o. male.  With a history of kidney stones presenting to the ED for evaluation of right sided flank pain with radiation to the right lower quadrant.  This began at approximately midnight last night.  It resolved for a while, but then woke him up at approximately 7:00 this morning.  Pain is severe and intermittent.  He has difficulty getting comfortable.  He states a history of 18-19 kidney stones in the past.  He last needed nephrolithiasis or ureteral stenting approximately 7 years ago.  He is able to urinate as normal.  He denies any urinary symptoms.  He reports some nausea with 3 episodes of emesis.  He denies any hematemesis.  No fevers or chills.  He states this pain feels exactly like previous kidney stones.   Flank Pain       Home Medications Prior to Admission medications   Medication Sig Start Date End Date Taking? Authorizing Provider  ketorolac (TORADOL) 10 MG tablet Take 1 tablet (10 mg total) by mouth every 8 (eight) hours as needed. 08/13/23  Yes Nyliah Nierenberg, Edsel Petrin, PA-C  oxyCODONE (ROXICODONE) 5 MG immediate release tablet Take 1 tablet (5 mg total) by mouth every 6 (six) hours as needed for severe pain. 08/13/23  Yes Lucylle Foulkes, Edsel Petrin, PA-C  tamsulosin (FLOMAX) 0.4 MG CAPS capsule Take 1 capsule (0.4 mg total) by mouth daily. 08/13/23  Yes Travonna Swindle, Edsel Petrin, PA-C  gabapentin (NEURONTIN) 300 MG capsule Take 1 capsule (300 mg total) by mouth 3 (three) times daily. 06/29/22   Adonis Huguenin, NP  oxyCODONE-acetaminophen (PERCOCET) 5-325 MG tablet Take 1 tablet by mouth every 6 (six) hours as needed for severe pain. 06/29/22   Adonis Huguenin, NP      Allergies    Bee venom and Keflex [cephalexin]    Review of Systems   Review of Systems   Genitourinary:  Positive for flank pain.  All other systems reviewed and are negative.   Physical Exam Updated Vital Signs BP (!) 156/96   Pulse 67   Temp (!) 97.4 F (36.3 C) (Oral)   Resp 15   Ht 5\' 11"  (1.803 m)   Wt 85.3 kg   SpO2 100%   BMI 26.22 kg/m  Physical Exam Vitals and nursing note reviewed.  Constitutional:      General: He is not in acute distress.    Appearance: He is well-developed.     Comments: Appears uncomfortable, constantly moving  HENT:     Head: Normocephalic and atraumatic.  Eyes:     Conjunctiva/sclera: Conjunctivae normal.  Cardiovascular:     Rate and Rhythm: Normal rate and regular rhythm.     Heart sounds: No murmur heard. Pulmonary:     Effort: Pulmonary effort is normal. No respiratory distress.     Breath sounds: Normal breath sounds.  Abdominal:     Palpations: Abdomen is soft.     Tenderness: There is no abdominal tenderness. There is no right CVA tenderness, left CVA tenderness or guarding.     Comments: No rashes  Musculoskeletal:        General: No swelling.     Cervical back: Neck supple.  Skin:    General: Skin is warm and dry.  Capillary Refill: Capillary refill takes less than 2 seconds.  Neurological:     General: No focal deficit present.     Mental Status: He is alert and oriented to person, place, and time.  Psychiatric:        Mood and Affect: Mood normal.     ED Results / Procedures / Treatments   Labs (all labs ordered are listed, but only abnormal results are displayed) Labs Reviewed  URINALYSIS, ROUTINE W REFLEX MICROSCOPIC - Abnormal; Notable for the following components:      Result Value   APPearance HAZY (*)    Hgb urine dipstick LARGE (*)    Protein, ur 30 (*)    Bacteria, UA RARE (*)    All other components within normal limits  COMPREHENSIVE METABOLIC PANEL - Abnormal; Notable for the following components:   CO2 21 (*)    Glucose, Bld 115 (*)    Total Bilirubin 1.3 (*)    All other  components within normal limits  CBC WITH DIFFERENTIAL/PLATELET  LIPASE, BLOOD    EKG EKG Interpretation Date/Time:  Sunday August 13 2023 09:47:45 EDT Ventricular Rate:  43 PR Interval:  161 QRS Duration:  99 QT Interval:  469 QTC Calculation: 397 R Axis:   -10  Text Interpretation: Sinus bradycardia No significant change since last tracing Confirmed by Elayne Snare ([id number]) on 08/13/2023 10:14:01 AM  Radiology CT RENAL STONE STUDY  Result Date: 08/13/2023 CLINICAL DATA:  Abdominal/flank pain with stone suspected EXAM: CT ABDOMEN AND PELVIS WITHOUT CONTRAST TECHNIQUE: Multidetector CT imaging of the abdomen and pelvis was performed following the standard protocol without IV contrast. RADIATION DOSE REDUCTION: This exam was performed according to the departmental dose-optimization program which includes automated exposure control, adjustment of the mA and/or kV according to patient size and/or use of iterative reconstruction technique. COMPARISON:  08/06/2020 FINDINGS: Lower chest:  No contributory findings. Hepatobiliary: Interval cystic density in the right lobe liver measuring 12 mm.No evidence of biliary obstruction or stone. Pancreas: Unremarkable. Spleen: Unremarkable. Adrenals/Urinary Tract: Negative adrenals. Right hydronephrosis. There is a 6 mm stone just below the UPJ. 2 small right renal calculi. Punctate left renal calculus at the lower pole. Unremarkable bladder. Stomach/Bowel:  No obstruction. No appendicitis. Vascular/Lymphatic: No acute vascular abnormality. No mass or adenopathy. Reproductive:No pathologic findings. Other: No ascites or pneumoperitoneum. Musculoskeletal: No acute abnormalities. IMPRESSION: 1. Obstructing 6 mm stone in the upper right ureter. 2. Small bilateral renal calculi. Electronically Signed   By: Tiburcio Pea M.D.   On: 08/13/2023 09:18    Procedures Procedures    Medications Ordered in ED Medications  sodium chloride 0.9 % bolus 1,000 mL  (1,000 mLs Intravenous New Bag/Given 08/13/23 0853)  HYDROmorphone (DILAUDID) injection 1 mg (1 mg Intravenous Given 08/13/23 0857)  ondansetron (ZOFRAN) injection 4 mg (4 mg Intravenous Given 08/13/23 0854)  ketorolac (TORADOL) 30 MG/ML injection 15 mg (15 mg Intravenous Given 08/13/23 2706)    ED Course/ Medical Decision Making/ A&P                                 Medical Decision Making Amount and/or Complexity of Data Reviewed Labs: ordered. Radiology: ordered.  Risk Prescription drug management.   This patient presents to the ED for concern of right-sided flank pain, this involves an extensive number of treatment options, and is a complaint that carries with it a high risk of complications and morbidity. The differential diagnosis  of emergent flank pain includes, but is not limited to :Abdominal aortic aneurysm,, Renal artery embolism,Renal vein thrombosis, Aortic dissection, Mesenteric ischemia, Pyelonephritis, Renal infarction, Renal hemorrhage, Nephrolithiasis/ Renal Colic, Bladder tumor,Cystitis, Biliary colic, Pancreatitis Perforated peptic ulcer Appendicitis ,Inguinal Hernia, Diverticulitis, Bowel obstruction Testicular torsion,Epididymitis Shingles Lower lobe pneumonia, Retroperitoneal hematoma/abscess/tumor, Epidural abscess, Epidural hematoma   My initial workup includes labs, imaging, symptom control  Additional history obtained from: Nursing notes from this visit. Family wife at bedside  I ordered, reviewed and interpreted labs which include: CBC, CMP, lipase, urinalysis  I ordered imaging studies including CT stone study I independently visualized and interpreted imaging which showed obstructing 6 mm stone in the right proximal ureter I agree with the radiologist interpretation  Afebrile, hemodynamically stable.  42 year old male presenting for evaluation of right-sided flank pain with radiation into the right lower quadrant.  This began around midnight last night.   He has an extensive history of kidney stones and states this feels very similar.  He denies any urinary complaints.  No fevers.  He has no tenderness to palpation of the abdomen.  No CVA tenderness.  Lab workup was reassuring.  Does show large hemoglobin in his urine which is consistent with a kidney stone.  CT was positive for an obstructing 6 mm stone in the right proximal ureter.  Patient reported significant improvement in his pain after treatment here in the emergency department.  He was able to tolerate p.o. intake without difficulty.  Will send him home on Flomax, oxycodone and Toradol.  He was given contact information for urology and encouraged to follow-up.  He was given return precautions.  Stable at discharge.  At this time there does not appear to be any evidence of an acute emergency medical condition and the patient appears stable for discharge with appropriate outpatient follow up. Diagnosis was discussed with patient who verbalizes understanding of care plan and is agreeable to discharge. I have discussed return precautions with patient and wife who verbalizes understanding. Patient encouraged to follow-up with their PCP within 1 week. All questions answered.  Note: Portions of this report may have been transcribed using voice recognition software. Every effort was made to ensure accuracy; however, inadvertent computerized transcription errors may still be present.        Final Clinical Impression(s) / ED Diagnoses Final diagnoses:  Ureterolithiasis    Rx / DC Orders ED Discharge Orders          Ordered    oxyCODONE (ROXICODONE) 5 MG immediate release tablet  Every 6 hours PRN        08/13/23 1048    ketorolac (TORADOL) 10 MG tablet  Every 8 hours PRN        08/13/23 1048    tamsulosin (FLOMAX) 0.4 MG CAPS capsule  Daily        08/13/23 1048              Michelle Piper, PA-C 08/13/23 1049    Elayne Snare K, DO 08/13/23 1109

## 2023-08-15 ENCOUNTER — Other Ambulatory Visit: Payer: Self-pay | Admitting: Urology

## 2023-08-15 ENCOUNTER — Encounter (HOSPITAL_BASED_OUTPATIENT_CLINIC_OR_DEPARTMENT_OTHER): Payer: Self-pay | Admitting: Urology

## 2023-08-15 NOTE — Progress Notes (Signed)
Spoke w/ via phone for pre-op interview--- pt Lab needs dos----   no      Lab results------ no COVID test -----patient states asymptomatic no test needed Arrive at ------- 0730 on 08-23-2023 NPO after MN NO Solid Food.  Clear liquids from MN until--- 0630 Med rec completed Medications to take morning of surgery ----- flomax Diabetic medication ----- no Patient instructed no nail polish to be worn day of surgery Patient instructed to bring photo id and insurance card day of surgery Patient aware to have Driver (ride ) / caregiver    for 24 hours after surgery - sig other, sara Patient Special Instructions ----- n/a Pre-Op special Instructions ----- n/a Patient verbalized understanding of instructions that were given at this phone interview. Patient denies chest pain, sob, fever, cough at the interview.

## 2023-08-23 ENCOUNTER — Ambulatory Visit (HOSPITAL_BASED_OUTPATIENT_CLINIC_OR_DEPARTMENT_OTHER): Payer: BC Managed Care – PPO | Admitting: Anesthesiology

## 2023-08-23 ENCOUNTER — Encounter (HOSPITAL_BASED_OUTPATIENT_CLINIC_OR_DEPARTMENT_OTHER)
Admission: RE | Disposition: A | Discharge status: Home / Self Care | Payer: Self-pay | Source: Home / Self Care | Attending: Urology

## 2023-08-23 ENCOUNTER — Ambulatory Visit (HOSPITAL_BASED_OUTPATIENT_CLINIC_OR_DEPARTMENT_OTHER)
Admission: RE | Admit: 2023-08-23 | Discharge: 2023-08-23 | Disposition: A | Discharge status: Home / Self Care | Payer: BC Managed Care – PPO | Attending: Urology | Admitting: Urology

## 2023-08-23 ENCOUNTER — Encounter (HOSPITAL_BASED_OUTPATIENT_CLINIC_OR_DEPARTMENT_OTHER): Payer: Self-pay | Admitting: Urology

## 2023-08-23 DIAGNOSIS — N132 Hydronephrosis with renal and ureteral calculous obstruction: Secondary | ICD-10-CM | POA: Diagnosis present

## 2023-08-23 DIAGNOSIS — Z87891 Personal history of nicotine dependence: Secondary | ICD-10-CM | POA: Diagnosis not present

## 2023-08-23 DIAGNOSIS — Z01818 Encounter for other preprocedural examination: Secondary | ICD-10-CM

## 2023-08-23 HISTORY — DX: Bradycardia, unspecified: R00.1

## 2023-08-23 HISTORY — DX: Calculus of kidney: N20.0

## 2023-08-23 HISTORY — PX: CYSTOSCOPY/URETEROSCOPY/HOLMIUM LASER/STENT PLACEMENT: SHX6546

## 2023-08-23 SURGERY — CYSTOSCOPY/URETEROSCOPY/HOLMIUM LASER/STENT PLACEMENT
Anesthesia: General | Site: Renal | Laterality: Right

## 2023-08-23 MED ORDER — PROPOFOL 10 MG/ML IV BOLUS
INTRAVENOUS | Status: AC
  Filled 2023-08-23: qty 20

## 2023-08-23 MED ORDER — NITROFURANTOIN MONOHYD MACRO 100 MG PO CAPS: 100.0000 mg | ORAL_CAPSULE | Freq: Two times a day (BID) | ORAL | 0 refills | Status: AC

## 2023-08-23 MED ORDER — HYOSCYAMINE SULFATE 0.125 MG SL SUBL: 0.1250 mg | SUBLINGUAL_TABLET | SUBLINGUAL | 0 refills | Status: AC | PRN

## 2023-08-23 MED ORDER — ONDANSETRON HCL 4 MG/2ML IJ SOLN
INTRAMUSCULAR | Status: AC
  Filled 2023-08-23: qty 2

## 2023-08-23 MED ORDER — MIDAZOLAM HCL 2 MG/2ML IJ SOLN
INTRAMUSCULAR | Status: AC
  Filled 2023-08-23: qty 2

## 2023-08-23 MED ORDER — ACETAMINOPHEN 500 MG PO TABS
ORAL_TABLET | ORAL | Status: AC
  Filled 2023-08-23: qty 2

## 2023-08-23 MED ORDER — OXYCODONE HCL 5 MG PO TABS
5.0000 mg | ORAL_TABLET | Freq: Four times a day (QID) | ORAL | 0 refills | Status: AC | PRN
Start: 2023-08-23 — End: ?

## 2023-08-23 MED ORDER — 0.9 % SODIUM CHLORIDE (POUR BTL) OPTIME
TOPICAL | Status: DC | PRN
Start: 2023-08-23 — End: 2023-08-23
  Administered 2023-08-23: 500 mL

## 2023-08-23 MED ORDER — FENTANYL CITRATE (PF) 100 MCG/2ML IJ SOLN
INTRAMUSCULAR | Status: AC
  Filled 2023-08-23: qty 2

## 2023-08-23 MED ORDER — DEXAMETHASONE SODIUM PHOSPHATE 10 MG/ML IJ SOLN
INTRAMUSCULAR | Status: DC | PRN
  Administered 2023-08-23: 10 mg via INTRAVENOUS

## 2023-08-23 MED ORDER — PROPOFOL 10 MG/ML IV BOLUS
INTRAVENOUS | Status: DC | PRN
  Administered 2023-08-23: 200 mg via INTRAVENOUS
  Administered 2023-08-23: 50 mg via INTRAVENOUS

## 2023-08-23 MED ORDER — ONDANSETRON HCL 4 MG/2ML IJ SOLN
INTRAMUSCULAR | Status: DC | PRN
  Administered 2023-08-23: 4 mg via INTRAVENOUS

## 2023-08-23 MED ORDER — IOHEXOL 300 MG/ML  SOLN
INTRAMUSCULAR | Status: DC | PRN
  Administered 2023-08-23: 20 mL via URETHRAL

## 2023-08-23 MED ORDER — STERILE WATER FOR IRRIGATION IR SOLN
Status: DC | PRN
  Administered 2023-08-23: 500 mL

## 2023-08-23 MED ORDER — CIPROFLOXACIN IN D5W 400 MG/200ML IV SOLN
INTRAVENOUS | Status: AC
  Filled 2023-08-23: qty 200

## 2023-08-23 MED ORDER — LACTATED RINGERS IV SOLN: INTRAVENOUS | Status: DC

## 2023-08-23 MED ORDER — ARTIFICIAL TEARS OPHTHALMIC OINT
TOPICAL_OINTMENT | OPHTHALMIC | Status: AC
  Filled 2023-08-23: qty 3.5

## 2023-08-23 MED ORDER — DEXAMETHASONE SODIUM PHOSPHATE 10 MG/ML IJ SOLN
INTRAMUSCULAR | Status: AC
  Filled 2023-08-23: qty 1

## 2023-08-23 MED ORDER — CELECOXIB 200 MG PO CAPS: 200.0000 mg | ORAL_CAPSULE | Freq: Two times a day (BID) | ORAL | 0 refills | Status: AC

## 2023-08-23 MED ORDER — SODIUM CHLORIDE 0.9 % IR SOLN
Status: DC | PRN
  Administered 2023-08-23: 3000 mL

## 2023-08-23 MED ORDER — MIDAZOLAM HCL 5 MG/5ML IJ SOLN
INTRAMUSCULAR | Status: DC | PRN
  Administered 2023-08-23: 2 mg via INTRAVENOUS

## 2023-08-23 MED ORDER — LIDOCAINE 2% (20 MG/ML) 5 ML SYRINGE
INTRAMUSCULAR | Status: DC | PRN
  Administered 2023-08-23: 80 mg via INTRAVENOUS

## 2023-08-23 MED ORDER — ACETAMINOPHEN 500 MG PO TABS
1000.0000 mg | ORAL_TABLET | Freq: Once | ORAL | Status: AC
  Administered 2023-08-23: 1000 mg via ORAL

## 2023-08-23 MED ORDER — FENTANYL CITRATE (PF) 100 MCG/2ML IJ SOLN
25.0000 ug | INTRAMUSCULAR | Status: DC | PRN
  Administered 2023-08-23: 50 ug via INTRAVENOUS

## 2023-08-23 MED ORDER — FENTANYL CITRATE (PF) 100 MCG/2ML IJ SOLN
INTRAMUSCULAR | Status: DC | PRN
  Administered 2023-08-23: 25 ug via INTRAVENOUS
  Administered 2023-08-23 (×2): 50 ug via INTRAVENOUS
  Administered 2023-08-23: 25 ug via INTRAVENOUS

## 2023-08-23 MED ORDER — CIPROFLOXACIN IN D5W 400 MG/200ML IV SOLN
400.0000 mg | Freq: Two times a day (BID) | INTRAVENOUS | Status: DC
  Administered 2023-08-23: 400 mg via INTRAVENOUS

## 2023-08-23 SURGICAL SUPPLY — 31 items
APL SKNCLS STERI-STRIP NONHPOA (GAUZE/BANDAGES/DRESSINGS)
BAG DRAIN URO-CYSTO SKYTR STRL (DRAIN) ×1 IMPLANT
BAG DRN UROCATH (DRAIN) ×1
BASKET ZERO TIP NITINOL 2.4FR (BASKET) IMPLANT
BENZOIN TINCTURE PRP APPL 2/3 (GAUZE/BANDAGES/DRESSINGS) IMPLANT
BSKT STON RTRVL ZERO TP 2.4FR (BASKET)
CATH URETERAL DUAL LUMEN 10F (MISCELLANEOUS) IMPLANT
CATH URETL OPEN END 6FR 70 (CATHETERS) ×1 IMPLANT
CLOTH BEACON ORANGE TIMEOUT ST (SAFETY) ×1 IMPLANT
COVER DOME SNAP 22 D (MISCELLANEOUS) ×1 IMPLANT
DRSG TEGADERM 2-3/8X2-3/4 SM (GAUZE/BANDAGES/DRESSINGS) IMPLANT
DRSG TEGADERM 4X4.75 (GAUZE/BANDAGES/DRESSINGS) IMPLANT
ELECT REM PT RETURN 9FT ADLT (ELECTROSURGICAL)
ELECTRODE REM PT RTRN 9FT ADLT (ELECTROSURGICAL) IMPLANT
GLOVE BIO SURGEON STRL SZ7 (GLOVE) ×1 IMPLANT
GOWN STRL REUS W/TWL LRG LVL3 (GOWN DISPOSABLE) ×1 IMPLANT
GUIDEWIRE STR DUAL SENSOR (WIRE) IMPLANT
GUIDEWIRE ZIPWRE .038 STRAIGHT (WIRE) IMPLANT
IV NS IRRIG 3000ML ARTHROMATIC (IV SOLUTION) ×2 IMPLANT
KIT TURNOVER CYSTO (KITS) ×1 IMPLANT
LASER FIB FLEXIVA PULSE ID 365 (Laser) IMPLANT
MANIFOLD NEPTUNE II (INSTRUMENTS) ×1 IMPLANT
NS IRRIG 500ML POUR BTL (IV SOLUTION) ×1 IMPLANT
PACK CYSTO (CUSTOM PROCEDURE TRAY) ×1 IMPLANT
SHEATH NAVIGATOR HD 11/13X36 (SHEATH) IMPLANT
SLEEVE SCD COMPRESS KNEE MED (STOCKING) ×1 IMPLANT
STENT URET 6FRX26 CONTOUR (STENTS) IMPLANT
TRACTIP FLEXIVA PULS ID 200XHI (Laser) IMPLANT
TRACTIP FLEXIVA PULSE ID 200 (Laser) ×1
TUBE CONNECTING 12X1/4 (SUCTIONS) IMPLANT
TUBING UROLOGY SET (TUBING) IMPLANT

## 2023-08-23 NOTE — Anesthesia Postprocedure Evaluation (Signed)
Anesthesia Post Note  Patient: Micheal Malone  Procedure(s) Performed: CYSTOSCOPY/RIGHT  RETROGRADE PYELOGRAM/RIGHT URETEROSCOPY/HOLMIUM LASER/RIGHT STENT PLACEMENT (Right: Renal)     Patient location during evaluation: PACU Anesthesia Type: General Level of consciousness: awake and alert Pain management: pain level controlled Vital Signs Assessment: post-procedure vital signs reviewed and stable Respiratory status: spontaneous breathing, nonlabored ventilation, respiratory function stable and patient connected to nasal cannula oxygen Cardiovascular status: blood pressure returned to baseline and stable Postop Assessment: no apparent nausea or vomiting Anesthetic complications: no  No notable events documented.  Last Vitals:  Vitals:   08/23/23 1108 08/23/23 1115  BP:  (!) 168/107  Pulse: (!) 43 (!) 59  Resp: 18 15  Temp:    SpO2: 100% 100%    Last Pain:  Vitals:   08/23/23 1115  TempSrc:   PainSc: 3                  Traci Gafford L Earnest Thalman

## 2023-08-23 NOTE — Anesthesia Procedure Notes (Signed)
Procedure Name: LMA Insertion Date/Time: 08/23/2023 9:32 AM  Performed by: Bishop Limbo, CRNAPre-anesthesia Checklist: Patient identified, Emergency Drugs available, Suction available and Patient being monitored Patient Re-evaluated:Patient Re-evaluated prior to induction Oxygen Delivery Method: Circle System Utilized Preoxygenation: Pre-oxygenation with 100% oxygen Induction Type: IV induction Ventilation: Mask ventilation without difficulty LMA: LMA inserted LMA Size: 5.0 Number of attempts: 1 Placement Confirmation: positive ETCO2 Tube secured with: Tape Dental Injury: Teeth and Oropharynx as per pre-operative assessment

## 2023-08-23 NOTE — Anesthesia Preprocedure Evaluation (Addendum)
Anesthesia Evaluation  Patient identified by MRN, date of birth, ID band Patient awake    Reviewed: Allergy & Precautions, NPO status , Patient's Chart, lab work & pertinent test results  Airway Mallampati: II  TM Distance: >3 FB Neck ROM: Full    Dental no notable dental hx. (+) Teeth Intact, Dental Advisory Given   Pulmonary neg pulmonary ROS   Pulmonary exam normal breath sounds clear to auscultation       Cardiovascular negative cardio ROS Normal cardiovascular exam Rhythm:Regular Rate:Normal     Neuro/Psych negative neurological ROS  negative psych ROS   GI/Hepatic negative GI ROS, Neg liver ROS,,,  Endo/Other  negative endocrine ROS    Renal/GU negative Renal ROS  negative genitourinary   Musculoskeletal negative musculoskeletal ROS (+)    Abdominal   Peds  Hematology negative hematology ROS (+)   Anesthesia Other Findings Right UPJ stone  Reproductive/Obstetrics                             Anesthesia Physical Anesthesia Plan  ASA: 2  Anesthesia Plan: General   Post-op Pain Management: Tylenol PO (pre-op)*   Induction: Intravenous  PONV Risk Score and Plan: 2 and Ondansetron, Dexamethasone and Midazolam  Airway Management Planned: LMA  Additional Equipment:   Intra-op Plan:   Post-operative Plan: Extubation in OR  Informed Consent: I have reviewed the patients History and Physical, chart, labs and discussed the procedure including the risks, benefits and alternatives for the proposed anesthesia with the patient or authorized representative who has indicated his/her understanding and acceptance.     Dental advisory given  Plan Discussed with: CRNA  Anesthesia Plan Comments:        Anesthesia Quick Evaluation

## 2023-08-23 NOTE — Discharge Instructions (Addendum)
[facility] [phone] Post Ureteroscopy With or Without Stent Instructions **ok to remove stent by pulling string Monday morning**  Definitions:  Ureter: The duct that transports urine from the kidney to the bladder. Stent:   A plastic hollow tube that is placed into the ureter, from the kidney to the bladder to prevent the ureter from swelling shut.  GENERAL INSTRUCTIONS:  Despite the fact that no skin incisions were used, the area around the ureter and bladder is raw and irritated. The stent is a foreign body which will further irritate the bladder wall. This irritation is manifested by increased frequency of urination, both day and night, and by an increase in the urge to urinate. In some, the urge to urinate is present almost always. Sometimes the urge is strong enough that you may not be able to stop yourself from urinating. The only real cure is to remove the stent and then give time for the bladder wall to heal which can't be done until the danger of the ureter swelling shut has passed, which varies.  You may see some blood in your urine while the stent is in place and a few days afterwards. Do not be alarmed, even if the urine was clear for a while. Get off your feet and drink lots of fluids until clearing occurs. If you start to pass clots or don't improve, call us.  DIET: You may return to your normal diet immediately. Because of the raw surface of your bladder, alcohol, spicy foods, acid type foods and drinks with caffeine may cause irritation or frequency and should be used in moderation. To keep your urine flowing freely and to avoid constipation, drink plenty of fluids during the day ( 8-10 glasses ). Tip: Avoid cranberry juice because it is very acidic.  ACTIVITY: Your physical activity doesn't need to be restricted. However, if you are very active, you may see some blood in your urine. We suggest that you reduce your activity under these circumstances until  the bleeding has stopped.  BOWELS: It is important to keep your bowels regular during the postoperative period. Straining with bowel movements can cause bleeding. A bowel movement every other day is reasonable. Use a mild laxative if needed, such as Milk of Magnesia 2-3 tablespoons, or 2 Dulcolax tablets. Call if you continue to have problems. If you have been taking narcotics for pain, before, during or after your surgery, you may be constipated. Take a laxative if necessary.   MEDICATION: You should resume your pre-surgery medications unless told not to. In addition you will often be given an antibiotic to prevent infection and likely several as needed medications for stent related discomfort. These should be taken as prescribed until the bottles are finished unless you are having an unusual reaction to one of the drugs.  PROBLEMS YOU SHOULD REPORT TO [place]: Fevers over 100.5 Fahrenheit. Heavy bleeding, or clots ( See above notes about blood in urine ). Inability to urinate. Drug reactions ( hives, rash, nausea, vomiting, diarrhea ). Severe burning or pain with urination that is not improving.   Post Anesthesia Home Care Instructions  Activity: Get plenty of rest for the remainder of the day. A responsible individual must stay with you for 24 hours following the procedure.  For the next 24 hours, DO NOT: -Drive a car -Advertising copywriter -Drink alcoholic beverages -Take any medication unless instructed by your physician -Make any legal decisions or sign important papers.  Meals: Start with liquid foods such as gelatin  or soup. Progress to regular foods as tolerated. Avoid greasy, spicy, heavy foods. If nausea and/or vomiting occur, drink only clear liquids until the nausea and/or vomiting subsides. Call your physician if vomiting continues.  Special Instructions/Symptoms: Your throat may feel dry or sore from the anesthesia or the breathing tube placed in your throat during surgery. If  this causes discomfort, gargle with warm salt water. The discomfort should disappear within 24 hours.  If needed, do not take Tylenol until after 2:30 pm today.

## 2023-08-23 NOTE — Op Note (Signed)
Operative Note  Preoperative diagnosis:  1.  Right ureteral stone 2. right hydronephrosis  Postoperative diagnosis: 1.  same  Procedure(s): 1.  Right ureteroscopy with laser lithotripsy and dusting of stones 2. Cystoscopy  3. Right retrograde pyelogram 4. Right ureteral stent placement 6x26 cm 5. Fluoroscopy with intraoperative interpretation  Surgeon: Irine Seal, MD  Assistants:  None  Anesthesia:  General  Complications:  None  EBL:  10 cc  Specimens: 1. none  Drains/Catheters: 1.  Right 6Fr x 26cm ureteral stent with tether string  Intraoperative findings:   Cystoscopy demonstrated unremarkable bladder Right Ureteroscopy demonstrated stone in proximal right ureter. There were a few early Randall's plaques in the kidney During the procedure, there was brief brisk bleeding from the collecting system. I did a retrograde at this time and there was no evidence of any sort of vascular communication with the collecting system. This was self-limited and improved after a minute or two Successful stent placement.   Description of procedure: After informed consent was obtained from the patient, the patient was identified and taken to the operating room and placed in the supine position.  General anesthesia was administered as well as perioperative IV antibiotics.  At the beginning of the case, a time-out was performed to properly identify the patient, the surgery to be performed, and the surgical site.  Sequential compression devices were applied to the lower extremities at the beginning of the case for DVT prophylaxis.  The patient was then placed in the dorsal lithotomy supine position, prepped and draped in sterile fashion.  We then passed the 21-French rigid cystoscope through the urethra and into the bladder under vision without any difficulty, noting a normal urethra without strictures and a non obstructing prostate.  A systematic evaluation of the bladder revealed no  evidence of any suspicious bladder lesions.  Ureteral orifices were in normal position.     we then passed a 0.038 glide wire up to the level of the renal pelvis on the right.  The cystoscope was withdrawn, and a dual lumen catheter was inserted over the glide wire into the distal ureter. A gentle retrograde pyelogram was performed, revealing a normal caliber ureter without any filling defects. There was mild hydronephrosis of the collecting system and no obvious filling defects. A 0.038 sensor wire was then passed up to the level of the renal pelvis and secured to the drape as a safety wire. The dual lumen was removed.  An 11/13Fr ureteral access sheath was carefully advanced up the ureter to the level of the UPJ over this wire under fluoroscopic guidance. The flexible ureteroscope was advanced into the collecting system via the access sheath. The collecting system was inspected. The calculus was identified in the proximal ureter. Using the 272 micron holmium laser fiber, I began to dust the stone. The pieces were blown in the kidney, where I continued to dust.   As mentioned above, while dusting the stone in a mid pole, posterior calyx, there was brief but brisk bleeding. I injected contrast and there was no evidence of collecting system perforation or extravasation into any vascular structures. After a few minutes, it slowed and visualization was improved.   With the ureteroscope in the kidney, a gentle pyelogram was performed to delineate the calyceal system and we evaluated the calyces systematically. We encountered no further stones. The rest of the stone fragments were very tiny and these were  irrigated away gently. The calyces were re-inspected and there were no significant stone fragment  residual.   We then withdrew the ureteroscope back down the ureter along with the access sheath, noting no evidence of any stones along the course of the ureter.  Prior to removing the ureteroscope, we did pass  the Glidewire back up to the ureter to the renal pelvis.  Once the ureteroscope was removed, we then used the Glidewire under fluoroscopic guidance and passed up a 6-French x 26 cm double-pigtail ureteral stent up the ureter, making sure that the proximal and distal ends coiled within the kidney and bladder respectively.  Note that we left a tether string attached to the distal end of the ureteral stent and it exited the urethral meatus and was secured to the penile shaft with a tegaderm adhesive.  The cystoscope was then advanced back into the bladder under vision.  We were able to see the distal stent coiling nicely within the bladder.  The bladder was then emptied with irrigation solution.  The cystoscope was then removed.    The patient tolerated the procedure well and there was no complication. Patient was awoken from anesthesia and taken to the recovery room in stable condition. I was present and scrubbed for the entirety of the case.  Plan:  Patient will be discharged home and may remove stent Monday morning   Neva Seat MD [facility]  Pager: [phone]

## 2023-08-23 NOTE — Transfer of Care (Signed)
Immediate Anesthesia Transfer of Care Note  Patient: KORION CUEVAS  Procedure(s) Performed: CYSTOSCOPY/RIGHT  RETROGRADE PYELOGRAM/RIGHT URETEROSCOPY/HOLMIUM LASER/RIGHT STENT PLACEMENT (Right: Renal)  Patient Location: PACU  Anesthesia Type:General  Level of Consciousness: awake and patient cooperative  Airway & Oxygen Therapy: Patient Spontanous Breathing and Patient connected to nasal cannula oxygen  Post-op Assessment: Report given to RN and Post -op Vital signs reviewed and stable  Post vital signs: Reviewed and stable  Last Vitals:  Vitals Value Taken Time  BP 145/99 08/23/23 1046  Temp    Pulse 65 08/23/23 1051  Resp 18 08/23/23 1051  SpO2 98 % 08/23/23 1051  Vitals shown include unfiled device data.  Last Pain:  Vitals:   08/23/23 0754  TempSrc: Oral  PainSc: 2       Patients Stated Pain Goal: 1 (08/23/23 0754)  Complications: No notable events documented.

## 2023-08-23 NOTE — H&P (Signed)
H&P  History of Present Illness: Micheal Malone is a 42 y.o. 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   right   Wears glasses     Past Surgical History:  Procedure Laterality Date   CYSTOSCOPY/URETEROSCOPY/HOLMIUM LASER/STENT PLACEMENT Left 02/20/2017   Procedure: CYSTOSCOPY LEFT URETEROSCOPY LEFT RETROGRADE PYELOGRAM [place] LASER LITHO / STONE BASKETRY /STENT PLACEMENT;  Surgeon: Ihor Gully, MD;  Location: [facility];  Service: Urology;  Laterality: Left;   FOOT ARTHRODESIS Left 06/03/2022   Procedure: LEFT FOOT LISFRANC FUSION;  Surgeon: Nadara Mustard, MD;  Location: [facility] OR;  Service: Orthopedics;  Laterality: Left;   VASECTOMY Bilateral 02/20/2017   Procedure: VASECTOMY BILATERAL;  Surgeon: Ihor Gully, MD;  Location: [facility];  Service: Urology;  Laterality: Bilateral;    Home Medications:  Current Meds  Medication Sig   ketorolac (TORADOL) 10 MG tablet Take 1 tablet (10 mg total) by mouth every 8 (eight) hours as needed.   ondansetron (ZOFRAN) 8 MG tablet Take 8 mg by mouth every 8 (eight) hours as needed for nausea or vomiting.   oxyCODONE (ROXICODONE) 5 MG immediate release tablet Take 1 tablet (5 mg total) by mouth every 6 (six) hours as needed for severe pain.   tamsulosin (FLOMAX) 0.4 MG CAPS capsule Take 1 capsule (0.4 mg total) by mouth daily.    Allergies:  Allergies  Allergen Reactions   Bee Venom Anaphylaxis   Keflex [Cephalexin] Hives    Family History  Problem Relation Age of Onset   Cerebral aneurysm Father    Heart failure Father     Social History:  reports that he has never smoked. He quit smokeless  tobacco use about 7 years ago.  His smokeless tobacco use included snuff. He reports current alcohol use of about 7.0 - 14.0 standard drinks of alcohol per week. He reports that he does not use drugs.  ROS: A complete review of systems was performed.  All systems are negative except for pertinent findings as noted.  Physical Exam:  Vital signs in last 24 hours: Temp:  [97.9 F (36.6 C)] 97.9 F (36.6 C) (10/16 0754) Pulse Rate:  [69] 69 (10/16 0754) Resp:  [17] 17 (10/16 0754) BP: (129)/(96) 129/96 (10/16 0754) SpO2:  [97 %] 97 % (10/16 0754) Weight:  [82.6 kg] 82.6 kg (10/16 0754) Constitutional:  Alert and oriented, No acute distress Cardiovascular: Regular rate and rhythm Respiratory: Normal respiratory effort, Lungs clear bilaterally GI: Abdomen is soft, nontender, nondistended, no abdominal masses Lymphatic: No lymphadenopathy Neurologic: Grossly intact, no focal deficits Psychiatric: Normal mood and affect   Laboratory Data:  No results for input(s): "WBC", "HGB", "HCT", "PLT" in the last 72 hours.  No results for input(s): "NA", "K", "CL", "GLUCOSE", "BUN", "CALCIUM", "CREATININE" in the last 72 hours.  Invalid input(s): "CO3"   No results found for this or any previous visit (from the past 24 hour(s)). No results found for this or any previous visit (from the past 240 hour(s)).  Renal Function: No results for input(s): "CREATININE" in the last 168 hours. Estimated Creatinine Clearance: 98.5 mL/min (by C-G formula based on SCr of 1.03  mg/dL).  Radiologic Imaging: No results found.  Assessment:  Micheal Malone is a 42 y.o. year old M with right ureteral stone, hydronephrosis  Plan:  --to OR as planned for right ureteroscopy with laser litho, stent. Procedure and risks reviewed, including but not limited to hematuria, infection, sepsis, damage to GU tract, failure to complete procedure, retained stone fragments, need for future procedures, stent pain, prolonged  stent.   Irine Seal, MD 08/23/2023, 8:30 AM  [facility] Pager: [phone]

## 2023-08-25 ENCOUNTER — Encounter (HOSPITAL_BASED_OUTPATIENT_CLINIC_OR_DEPARTMENT_OTHER): Payer: Self-pay | Admitting: Urology

## 2024-02-28 ENCOUNTER — Encounter (HOSPITAL_COMMUNITY): Payer: Self-pay | Admitting: Emergency Medicine

## 2024-02-28 ENCOUNTER — Emergency Department (HOSPITAL_COMMUNITY)
Admission: EM | Admit: 2024-02-28 | Discharge: 2024-02-28 | Disposition: A | Discharge status: Home / Self Care | Attending: Emergency Medicine | Admitting: Emergency Medicine

## 2024-02-28 ENCOUNTER — Other Ambulatory Visit: Payer: Self-pay

## 2024-02-28 ENCOUNTER — Emergency Department (HOSPITAL_COMMUNITY)

## 2024-02-28 DIAGNOSIS — J069 Acute upper respiratory infection, unspecified: Secondary | ICD-10-CM | POA: Insufficient documentation

## 2024-02-28 DIAGNOSIS — L509 Urticaria, unspecified: Secondary | ICD-10-CM | POA: Diagnosis not present

## 2024-02-28 DIAGNOSIS — R509 Fever, unspecified: Secondary | ICD-10-CM | POA: Diagnosis present

## 2024-02-28 LAB — HEPATIC FUNCTION PANEL
ALT: 27 U/L (ref 0–44)
AST: 24 U/L (ref 15–41)
Albumin: 4.3 g/dL (ref 3.5–5.0)
Alkaline Phosphatase: 40 U/L (ref 38–126)
Bilirubin, Direct: 0.3 mg/dL — ABNORMAL HIGH (ref 0.0–0.2)
Indirect Bilirubin: 1.6 mg/dL — ABNORMAL HIGH (ref 0.3–0.9)
Total Bilirubin: 1.9 mg/dL — ABNORMAL HIGH (ref 0.0–1.2)
Total Protein: 7.1 g/dL (ref 6.5–8.1)

## 2024-02-28 LAB — RESP PANEL BY RT-PCR (RSV, FLU A&B, COVID)  RVPGX2
Influenza A by PCR: NEGATIVE
Influenza B by PCR: NEGATIVE
Resp Syncytial Virus by PCR: NEGATIVE
SARS Coronavirus 2 by RT PCR: NEGATIVE

## 2024-02-28 LAB — CBC
HCT: 45.8 % (ref 39.0–52.0)
Hemoglobin: 16 g/dL (ref 13.0–17.0)
MCH: 30.3 pg (ref 26.0–34.0)
MCHC: 34.9 g/dL (ref 30.0–36.0)
MCV: 86.7 fL (ref 80.0–100.0)
Platelets: 242 10*3/uL (ref 150–400)
RBC: 5.28 MIL/uL (ref 4.22–5.81)
RDW: 11.9 % (ref 11.5–15.5)
WBC: 4.3 10*3/uL (ref 4.0–10.5)
nRBC: 0 % (ref 0.0–0.2)

## 2024-02-28 LAB — BASIC METABOLIC PANEL WITH GFR
Anion gap: 11 (ref 5–15)
BUN: 13 mg/dL (ref 6–20)
CO2: 24 mmol/L (ref 22–32)
Calcium: 9.3 mg/dL (ref 8.9–10.3)
Chloride: 100 mmol/L (ref 98–111)
Creatinine, Ser: 0.93 mg/dL (ref 0.61–1.24)
GFR, Estimated: >60 mL/min (ref >60)
Glucose, Bld: 103 mg/dL — ABNORMAL HIGH (ref 70–99)
Potassium: 4.2 mmol/L (ref 3.5–5.1)
Sodium: 135 mmol/L (ref 135–145)

## 2024-02-28 LAB — TROPONIN I (HIGH SENSITIVITY)
Troponin I (High Sensitivity): <2 ng/L (ref <18)
Troponin I (High Sensitivity): <2 ng/L (ref <18)

## 2024-02-28 MED ORDER — FAMOTIDINE 20 MG PO TABS
40.0000 mg | ORAL_TABLET | Freq: Once | ORAL | Status: AC
  Administered 2024-02-28: 40 mg via ORAL
  Filled 2024-02-28: qty 2

## 2024-02-28 MED ORDER — DIPHENHYDRAMINE HCL 50 MG/ML IJ SOLN
25.0000 mg | Freq: Once | INTRAMUSCULAR | Status: AC
  Administered 2024-02-28: 25 mg via INTRAVENOUS
  Filled 2024-02-28: qty 1

## 2024-02-28 MED ORDER — ACETAMINOPHEN 325 MG PO TABS
650.0000 mg | ORAL_TABLET | Freq: Once | ORAL | Status: AC
  Administered 2024-02-28: 650 mg via ORAL
  Filled 2024-02-28: qty 2

## 2024-02-28 MED ORDER — METHYLPREDNISOLONE SODIUM SUCC 125 MG IJ SOLR
125.0000 mg | Freq: Once | INTRAMUSCULAR | Status: AC
  Administered 2024-02-28: 125 mg via INTRAVENOUS
  Filled 2024-02-28: qty 2

## 2024-02-28 MED ORDER — IPRATROPIUM-ALBUTEROL 0.5-2.5 (3) MG/3ML IN SOLN
3.0000 mL | Freq: Once | RESPIRATORY_TRACT | Status: AC
  Administered 2024-02-28: 3 mL via RESPIRATORY_TRACT
  Filled 2024-02-28: qty 3

## 2024-02-28 MED ORDER — ALBUTEROL SULFATE HFA 108 (90 BASE) MCG/ACT IN AERS: 2.0000 | INHALATION_SPRAY | RESPIRATORY_TRACT | 0 refills | Status: AC | PRN

## 2024-02-28 MED ORDER — PREDNISONE 50 MG PO TABS: ORAL_TABLET | ORAL | 0 refills | Status: AC

## 2024-02-28 NOTE — ED Provider Triage Note (Signed)
 Emergency Medicine Provider Triage Evaluation Note  Micheal Malone , a 43 y.o. male  was evaluated in triage.  Pt complains of chest pain and breaking out in hives..  Review of Systems  Positive:  Negative:   Physical Exam  BP (!) 130/94   Pulse 92   Temp 98.4 F (36.9 C) (Oral)   Resp 16   Ht 1.803 m (5\' 11" )   Wt 82.6 kg   SpO2 99%   BMI 25.40 kg/m  Gen:   Awake, no distress   Resp:  Normal effort  MSK:   Moves extremities without difficulty  Other:    Medical Decision Making  Medically screening exam initiated at 1:10 PM.  Appropriate orders placed.  Micheal Malone was informed that the remainder of the evaluation will be completed by another provider, this initial triage assessment does not replace that evaluation, and the importance of remaining in the ED until their evaluation is complete.  See other note.   Micheal Gangl, MD 02/28/24 1310

## 2024-02-28 NOTE — ED Notes (Signed)
 Pt just noticed a rash developing under arms. Complains of itching. Did not notice it this morning just started itching. No other rashes and no difficulty swallowing.

## 2024-02-28 NOTE — ED Provider Notes (Signed)
 [place] EMERGENCY DEPARTMENT AT [facility] Provider Note   CSN: [id number] Arrival date & time: 02/28/24  1049     History  Chief Complaint  Patient presents with   Chest Pain   Shortness of Breath    BUD Micheal Malone is a 43 y.o. male.  43 year old male with no reported past medical history presenting to the emergency department today with fever and chest tightness.  The patient states that he woke up this morning at around 1245 with a temperature of 102.  He was having some chest tightness at the time.  He states that when he got here he noticed that he was having some hives as well.  The patient denies any difficulty breathing or swallowing with this.  He does vape but denies any tobacco use.  He denies any history of asthma or COPD.  He came to the ER today for further evaluation.  Denies any cough.   Chest Pain Associated symptoms: shortness of breath   Shortness of Breath Associated symptoms: chest pain        Home Medications Prior to Admission medications   Medication Sig Start Date End Date Taking? Authorizing Provider  albuterol  (VENTOLIN  HFA) 108 (90 Base) MCG/ACT inhaler Inhale 2 puffs into the lungs every 4 (four) hours as needed for wheezing or shortness of breath. 02/28/24  Yes Carin Charleston, MD  predniSONE  (DELTASONE ) 50 MG tablet Take 1 tablet by mouth daily 02/28/24  Yes Carin Charleston, MD  hyoscyamine  (LEVSIN/SL) 0.125 MG SL tablet Place 1 tablet (0.125 mg total) under the tongue every 4 (four) hours as needed. 08/23/23   Mallie Seal, MD  ketorolac  (TORADOL ) 10 MG tablet Take 1 tablet (10 mg total) by mouth every 8 (eight) hours as needed. 08/13/23   Schutt, Coni Deep, PA-C  ondansetron  (ZOFRAN ) 8 MG tablet Take 8 mg by mouth every 8 (eight) hours as needed for nausea or vomiting.    [provider]  oxyCODONE  (ROXICODONE ) 5 MG immediate release tablet Take 1 tablet (5 mg total) by mouth every 6 (six) hours as needed for severe pain  (pain score 7-10). 08/23/23   Mallie Seal, MD  tamsulosin  (FLOMAX ) 0.4 MG CAPS capsule Take 1 capsule (0.4 mg total) by mouth daily. 08/13/23   Schutt, Coni Deep, PA-C      Allergies    Bee venom and Keflex [cephalexin]    Review of Systems   Review of Systems  Respiratory:  Positive for shortness of breath.   Cardiovascular:  Positive for chest pain.  All other systems reviewed and are negative.   Physical Exam Updated Vital Signs BP 121/87   Pulse 88   Temp 100.2 F (37.9 C) (Oral)   Resp 18   Ht 5\' 11"  (1.803 m)   Wt 82.6 kg   SpO2 97%   BMI 25.40 kg/m  Physical Exam Vitals and nursing note reviewed.   Gen: NAD Eyes: PERRL, EOMI HEENT: no oropharyngeal swelling Neck: trachea midline Resp: clear to auscultation bilaterally Card: RRR, no murmurs, rubs, or gallops Abd: nontender, nondistended Extremities: no calf tenderness, no edema Vascular: 2+ radial pulses bilaterally, 2+ DP pulses bilaterally Skin: no rashes Psyc: acting appropriately   ED Results / Procedures / Treatments   Labs (all labs ordered are listed, but only abnormal results are displayed) Labs Reviewed  BASIC METABOLIC PANEL WITH GFR - Abnormal; Notable for the following components:      Result Value   Glucose, Bld  103 (*)    All other components within normal limits  HEPATIC FUNCTION PANEL - Abnormal; Notable for the following components:   Total Bilirubin 1.9 (*)    Bilirubin, Direct 0.3 (*)    Indirect Bilirubin 1.6 (*)    All other components within normal limits  RESP PANEL BY RT-PCR (RSV, FLU A&B, COVID)  RVPGX2  CBC  TROPONIN I (HIGH SENSITIVITY)  TROPONIN I (HIGH SENSITIVITY)    EKG EKG Interpretation Date/Time:  Wednesday February 28 2024 11:40:04 EDT Ventricular Rate:  88 PR Interval:  160 QRS Duration:  86 QT Interval:  346 QTC Calculation: 418 R Axis:   3  Text Interpretation: Sinus rhythm with sinus arrhythmia with occasional Premature ventricular complexes  Otherwise normal ECG When compared with ECG of 13-Aug-2023 09:47, PREVIOUS ECG IS PRESENT Confirmed by Zackowski, Scott [phone]) on 02/28/2024 12:47:16 PM  Radiology DG Chest 2 View Result Date: 02/28/2024 CLINICAL DATA:  Chest pain EXAM: CHEST - 2 VIEW COMPARISON:  08/06/2020 FINDINGS: Heart and mediastinal contours are within normal limits. No focal opacities or effusions. No acute bony abnormality. IMPRESSION: No active cardiopulmonary disease. Electronically Signed   By: Janeece Mechanic M.D.   On: 02/28/2024 12:02    Procedures Procedures    Medications Ordered in ED Medications  diphenhydrAMINE  (BENADRYL ) injection 25 mg (25 mg Intravenous Given 02/28/24 1312)  methylPREDNISolone  sodium succinate (SOLU-MEDROL ) 125 mg/2 mL injection 125 mg (125 mg Intravenous Given 02/28/24 1609)  ipratropium-albuterol  (DUONEB) 0.5-2.5 (3) MG/3ML nebulizer solution 3 mL (3 mLs Nebulization Given 02/28/24 1609)    ED Course/ Medical Decision Making/ A&P                                 Medical Decision Making 43 year old male presenting to the emergency department today with chest tightness and fever.  I will further evaluate the patient here with basic labs well as an EKG, troponin, and chest x-ray to evaluate for ACS, pulmonary edema, pulmonary infiltrates, pneumothorax, myocarditis, or pericarditis.  Also obtain RSV/COVID/flu swab on the patient.  Patient has some minimal hives here.  No respiratory distress.  Will give the patient Solu-Medrol  as well as a DuoNeb in addition to the Benadryl  he received in triage.  I will reevaluate for ultimate disposition.  This very well may be due to viral syndrome.  The patient's labs as well as 2 troponins here were negative.  He remained stable here.  I think he is stable for discharge.  Is likely secondary to viral syndrome given his reassuring evaluation here today.  He is discharged with return precautions.  Amount and/or Complexity of Data Reviewed Labs:  ordered. Radiology: ordered.  Risk Prescription drug management.           Final Clinical Impression(s) / ED Diagnoses Final diagnoses:  Upper respiratory tract infection, unspecified type  Urticaria    Rx / DC Orders ED Discharge Orders          Ordered    predniSONE  (DELTASONE ) 50 MG tablet        02/28/24 1731    albuterol  (VENTOLIN  HFA) 108 (90 Base) MCG/ACT inhaler  Every 4 hours PRN        02/28/24 1731              Carin Charleston, MD 02/28/24 1733

## 2024-02-28 NOTE — ED Provider Notes (Signed)
 EMS triage Medical screening   Patient with the onset anterior substernal chest pain shortly after midnight around 1245.  Patient had shortness of breath last night as well and developed a fever.  And here just recently he developed hives in his axillary area.  Patient only took Motrin  as a medication is not on any other medication.  No tongue swelling no lip swelling.   Patient has been seen by cardiology in the past but it was in 2021.  Patient's initial troponin here reassuring at less than 2 chest x-ray without any acute findings CBC is normal and basic metabolic panel is normal as well.  Will need delta troponin.  For the developing hives we will give some IV Benadryl .  Patient's temp here 98.4 pulse 92 blood pressure 130/94 respiration 16 oxygen saturation 99%.  Lungs clear to auscultation bilaterally  heart regular rate and rhythm abdomen soft and nontender.   Marked hives in the axillary area and along the trunk area.   Triage completed   Jayleon Mcfarlane, MD 02/28/24 1306

## 2024-02-28 NOTE — Discharge Instructions (Signed)
 Your workup today was reassuring.  Your heart tests were unremarkable.  Chest x-ray did not show any concerning findings.  I think that your symptoms are likely from a viral illness.  Please take the steroids and uses 2 puffs of the albuterol  every 4 hours as needed for cough or chest tightness.  Please follow-up with your doctor.  Return to the ER for worsening symptoms.

## 2024-02-28 NOTE — ED Notes (Signed)
 Patient discharged by this RN. Patient verbalizes understanding of instructions. Ambulatory to lobby at time of discharge.

## 2024-02-28 NOTE — ED Triage Notes (Addendum)
 Pt complains of chest pain and SOB started last night. Today started to have numbness in fingers on left hand. Also have fever and chills last night. Denies n/v/d. History of MI

## 2024-07-14 ENCOUNTER — Emergency Department (HOSPITAL_BASED_OUTPATIENT_CLINIC_OR_DEPARTMENT_OTHER): Admitting: Radiology

## 2024-07-14 ENCOUNTER — Emergency Department (HOSPITAL_BASED_OUTPATIENT_CLINIC_OR_DEPARTMENT_OTHER)
Admission: EM | Admit: 2024-07-14 | Discharge: 2024-07-14 | Disposition: A | Discharge status: Home / Self Care | Attending: Emergency Medicine | Admitting: Emergency Medicine

## 2024-07-14 ENCOUNTER — Other Ambulatory Visit: Payer: Self-pay

## 2024-07-14 DIAGNOSIS — M25462 Effusion, left knee: Secondary | ICD-10-CM | POA: Insufficient documentation

## 2024-07-14 DIAGNOSIS — M25562 Pain in left knee: Secondary | ICD-10-CM | POA: Insufficient documentation

## 2024-07-14 MED ORDER — IBUPROFEN 600 MG PO TABS: 600.0000 mg | ORAL_TABLET | Freq: Four times a day (QID) | ORAL | 0 refills | Status: AC | PRN

## 2024-07-14 MED ORDER — CYCLOBENZAPRINE HCL 10 MG PO TABS: 10.0000 mg | ORAL_TABLET | Freq: Two times a day (BID) | ORAL | 0 refills | Status: AC | PRN

## 2024-07-14 NOTE — ED Triage Notes (Signed)
 C/o left knee pain and swelling since yesterday. Denies injury.

## 2024-07-14 NOTE — ED Provider Notes (Signed)
 [place] EMERGENCY DEPARTMENT AT [facility] Provider Note   CSN: [id number] Arrival date & time: 07/14/24  1356     Patient presents with: Knee Pain   Micheal Malone is a 43 y.o. male.   The history is provided by the patient and medical records. No language interpreter was used.  Knee Pain Associated symptoms: no fever      43 year old male presenting with complaint of knee pain.  Patient noticed pain and swelling to his left knee since yesterday.  Pain described as a throbbing sensation hurts with movement mildly improved with taking Advil  at home.  No hip pain or ankle pain no numbness no fever or chills.  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  Prior to Admission medications   Medication Sig Start Date End Date Taking? Authorizing Provider  albuterol  (VENTOLIN  HFA) 108 (90 Base) MCG/ACT inhaler Inhale 2 puffs into the lungs every 4 (four) hours as needed for wheezing or shortness of breath. 02/28/24   Ula Prentice SAUNDERS, MD  hyoscyamine  (LEVSIN AMIEL) 0.125 MG SL tablet Place 1 tablet (0.125 mg total) under the tongue every 4 (four) hours as needed. 08/23/23   Lovie Arlyss CROME, MD  ketorolac  (TORADOL ) 10 MG tablet Take 1 tablet (10 mg total) by mouth every 8 (eight) hours as needed. 08/13/23   Schutt, Marsa HERO, PA-C  ondansetron  (ZOFRAN ) 8 MG tablet Take 8 mg by mouth every 8 (eight) hours as needed for nausea or vomiting.    [provider]  oxyCODONE  (ROXICODONE ) 5 MG immediate release tablet Take 1 tablet (5 mg total) by mouth every 6 (six) hours as needed for severe pain (pain score 7-10). 08/23/23   Lovie Arlyss CROME, MD  predniSONE  (DELTASONE ) 50 MG tablet Take 1 tablet by mouth daily 02/28/24   Ula Prentice SAUNDERS, MD  tamsulosin  (FLOMAX ) 0.4 MG CAPS capsule Take 1 capsule (0.4 mg total) by mouth daily. 08/13/23   Schutt, Marsa HERO, PA-C    Allergies: Bee venom and Keflex  [cephalexin]    Review of Systems  Constitutional:  Negative for fever.  Musculoskeletal:  Positive for joint swelling.  Skin:  Negative for wound.    Updated Vital Signs BP 125/88   Pulse 64   Temp 97.7 F (36.5 C)   Resp 15   SpO2 100%   Physical Exam Constitutional:      General: He is not in acute distress.    Appearance: He is well-developed.  HENT:     Head: Atraumatic.  Eyes:     Conjunctiva/sclera: Conjunctivae normal.  Musculoskeletal:        General: Tenderness (Left knee: Edema noted to the infrapatellar region with some mild tenderness to palpation.  No significant erythema or warmth appreciated.  Able to flex and extend the knee.  Patella is located.  No joint laxity.) present.     Cervical back: Normal range of motion and neck supple.  Skin:    Findings: No rash.  Neurological:     Mental Status: He is alert.     (all labs ordered are listed, but only abnormal results are displayed) Labs Reviewed - No data to display  EKG: None  Radiology: No results found.   Procedures   Medications Ordered in the ED - No data to display  Medical Decision Making Amount and/or Complexity of Data Reviewed Radiology: ordered.   BP 125/88   Pulse 64   Temp 97.7 F (36.5 C)   Resp 15   SpO2 100%   20:5 PM   43 year old male presenting with complaint of knee pain.  Patient noticed pain and swelling to his left knee since yesterday.  Pain described as a throbbing sensation hurts with movement mildly improved with taking Advil  at home.  No hip pain or ankle pain no numbness no fever or chills.  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  Examination of left knee showing swelling to the prepatellar region but otherwise patient able to flex and extend the knee and no deformity noted.  No erythema or warmth.  Risk Prescription drug management.      HPI summary patient here with atraumatic left knee pain and swelling.  Findings suggestive of prepatellar bursitis.  Doubt septic joint.  Will provide supportive care and outpatient follow-up.   Initial Ddx:  Bursitis, septic joint, fracture, dislocation, internal derangement of the knee, cellulitis   MDM/Course:  X-ray independently viewed and interpreted by me which shows prepatellar soft tissue swelling, this is consistent with patient presentation.  Agrees with radiologist interpretation.   This patient presents to the ED for concern of complaints listed in HPI, this involves an extensive number of treatment options, and is a complaint that carries with it a high risk of complications and morbidity. Disposition including potential need for admission considered.    Dispo: DC Home. Return precautions discussed including, but not limited to, those listed in the AVS. Allowed pt time to ask questions which were answered fully prior to dc.   Additional history obtained from wife Records reviewed  I have reviewed the patients home medications and made adjustments as needed Social Determinants of health: History of tobacco use  Portions of this note were generated with Scientist, clinical (histocompatibility and immunogenetics). Dictation errors may occur despite best attempts at proofreading.       Final diagnoses:  Effusion of bursa of left knee    ED Discharge Orders          Ordered    ibuprofen  (ADVIL ) 600 MG tablet  Every 6 hours PRN        07/14/24 1535    cyclobenzaprine  (FLEXERIL ) 10 MG tablet  2 times daily PRN        07/14/24 1535               Nivia Colon, PA-C 07/14/24 1536    Pamella Sharper A, DO 07/24/24 1224
